# Patient Record
Sex: Male | Born: 1945 | Race: White | Hispanic: No | Marital: Married
Health system: Southern US, Community
[De-identification: ages and names within clinical notes are randomized; demographics above are authoritative.]

---

## 2016-12-08 ENCOUNTER — Inpatient Hospital Stay
Admission: AD | Admit: 2016-12-08 | Discharge: 2017-01-12 | Disposition: A | Discharge status: Skilled Nursing Facility | Payer: Self-pay | Source: Ambulatory Visit | Attending: Internal Medicine | Admitting: Internal Medicine

## 2016-12-08 DIAGNOSIS — Z992 Dependence on renal dialysis: Secondary | ICD-10-CM

## 2016-12-08 DIAGNOSIS — R509 Fever, unspecified: Secondary | ICD-10-CM

## 2016-12-08 DIAGNOSIS — D72829 Elevated white blood cell count, unspecified: Secondary | ICD-10-CM

## 2016-12-08 DIAGNOSIS — J189 Pneumonia, unspecified organism: Secondary | ICD-10-CM

## 2016-12-09 LAB — CBC
HCT: 30.6 % — ABNORMAL LOW (ref 39.0–52.0)
Hemoglobin: 9.5 g/dL — ABNORMAL LOW (ref 13.0–17.0)
MCH: 28.8 pg (ref 26.0–34.0)
MCHC: 31 g/dL (ref 30.0–36.0)
MCV: 92.7 fL (ref 78.0–100.0)
PLATELETS: 167 10*3/uL (ref 150–400)
RBC: 3.3 MIL/uL — ABNORMAL LOW (ref 4.22–5.81)
RDW: 17.1 % — AB (ref 11.5–15.5)
WBC: 11.1 10*3/uL — AB (ref 4.0–10.5)

## 2016-12-11 LAB — URINALYSIS, ROUTINE W REFLEX MICROSCOPIC
Bilirubin Urine: NEGATIVE
Glucose, UA: NEGATIVE mg/dL
KETONES UR: NEGATIVE mg/dL
NITRITE: NEGATIVE
PH: 5 (ref 5.0–8.0)
Protein, ur: 100 mg/dL — AB
Specific Gravity, Urine: 1.024 (ref 1.005–1.030)

## 2016-12-11 LAB — BLOOD GAS, ARTERIAL
ACID-BASE DEFICIT: 4.7 mmol/L — AB (ref 0.0–2.0)
Bicarbonate: 20.2 mmol/L (ref 20.0–28.0)
O2 Content: 2 L/min
O2 Saturation: 97.5 %
PH ART: 7.333 — AB (ref 7.350–7.450)
Patient temperature: 98.6
pCO2 arterial: 39.1 mmHg (ref 32.0–48.0)
pO2, Arterial: 96.3 mmHg (ref 83.0–108.0)

## 2016-12-11 LAB — MAGNESIUM: Magnesium: 2.4 mg/dL (ref 1.7–2.4)

## 2016-12-11 LAB — CBC WITH DIFFERENTIAL/PLATELET
Basophils Absolute: 0 10*3/uL (ref 0.0–0.1)
Basophils Relative: 0 %
Eosinophils Absolute: 0.8 10*3/uL — ABNORMAL HIGH (ref 0.0–0.7)
Eosinophils Relative: 6 %
HEMATOCRIT: 31.5 % — AB (ref 39.0–52.0)
HEMOGLOBIN: 9.6 g/dL — AB (ref 13.0–17.0)
Lymphocytes Relative: 10 %
Lymphs Abs: 1.3 10*3/uL (ref 0.7–4.0)
MCH: 28.2 pg (ref 26.0–34.0)
MCHC: 30.5 g/dL (ref 30.0–36.0)
MCV: 92.6 fL (ref 78.0–100.0)
MONOS PCT: 16 %
Monocytes Absolute: 2 10*3/uL — ABNORMAL HIGH (ref 0.1–1.0)
NEUTROS ABS: 8.8 10*3/uL — AB (ref 1.7–7.7)
NEUTROS PCT: 68 %
Platelets: 242 10*3/uL (ref 150–400)
RBC: 3.4 MIL/uL — ABNORMAL LOW (ref 4.22–5.81)
RDW: 17.2 % — ABNORMAL HIGH (ref 11.5–15.5)
WBC: 12.9 10*3/uL — ABNORMAL HIGH (ref 4.0–10.5)

## 2016-12-11 LAB — BASIC METABOLIC PANEL
Anion gap: 19 — ABNORMAL HIGH (ref 5–15)
BUN: 94 mg/dL — ABNORMAL HIGH (ref 6–20)
CHLORIDE: 91 mmol/L — AB (ref 101–111)
CO2: 18 mmol/L — ABNORMAL LOW (ref 22–32)
CREATININE: 6.05 mg/dL — AB (ref 0.61–1.24)
Calcium: 9.5 mg/dL (ref 8.9–10.3)
GFR calc Af Amer: 10 mL/min — ABNORMAL LOW (ref >60)
GFR calc non Af Amer: 8 mL/min — ABNORMAL LOW (ref >60)
Glucose, Bld: 155 mg/dL — ABNORMAL HIGH (ref 65–99)
Potassium: 4.6 mmol/L (ref 3.5–5.1)
SODIUM: 128 mmol/L — AB (ref 135–145)

## 2016-12-11 LAB — RENAL FUNCTION PANEL
ANION GAP: 17 — AB (ref 5–15)
Albumin: 3.4 g/dL — ABNORMAL LOW (ref 3.5–5.0)
BUN: 95 mg/dL — ABNORMAL HIGH (ref 6–20)
CO2: 20 mmol/L — ABNORMAL LOW (ref 22–32)
Calcium: 9.5 mg/dL (ref 8.9–10.3)
Chloride: 92 mmol/L — ABNORMAL LOW (ref 101–111)
Creatinine, Ser: 6.04 mg/dL — ABNORMAL HIGH (ref 0.61–1.24)
GFR calc Af Amer: 10 mL/min — ABNORMAL LOW (ref >60)
GFR calc non Af Amer: 8 mL/min — ABNORMAL LOW (ref >60)
GLUCOSE: 157 mg/dL — AB (ref 65–99)
PHOSPHORUS: 7.8 mg/dL — AB (ref 2.5–4.6)
POTASSIUM: 4.6 mmol/L (ref 3.5–5.1)
Sodium: 129 mmol/L — ABNORMAL LOW (ref 135–145)

## 2016-12-11 LAB — TSH: TSH: 2.202 u[IU]/mL (ref 0.350–4.500)

## 2016-12-11 NOTE — Progress Notes (Signed)
[facility] CONSULT NOTE    Date: 12/11/2016                  Patient Name:  Jamie Kaiser  MRN: [id number]  DOB: 06/04/1946  Age / Sex: 71 y.o., male         PCP: System, Pcp Not In                 Service Requesting Consult: Hospitalist                 Reason for Consult: Acute renal failure/CKD stage III            History of Present Illness: Patient is a 71 y.o. male with a PMHx of acute on chronic diastolic heart,acute renal failure with underlying chronic kidney disease stage III, recent Escherichia coli bacteremia with urinary tract infection, atrial fibrillation, history of acute pericardial effusion, chronic traumatic encephalopathy, diabetes mellitus type 2, morbid obesity, obstructive sleep apnea, hypertension, debility, chronic venous ulceration, and generalized edema, recent metabolic encephalopathy, who was admitted [facility] for ongoing management.  He was admitted to the outside hospital from 11/15/16 to 12/08/2016.  He had a fall at home prior to admission at the outside hospital. He was found to have cellulitis as well as UTI initially pretty was treated with empiric antibiotics.  His baseline creatinine appears to be 2 per prior documents. In addition he was diuresis. Nephrology consultation was pursued. They escalated his diuretics. He was started on dialysis on June 6 and 18. He appears to have 2 dialysis catheters in place.   Medications: Recent discharge medications: 5 mg daily, calcium acetate 2 tablets by mouth 3 times a day with meals, Colace 100 mg twice a day, Lasix drip, Neurontin 600 mg daily, hydrocodone/acetaminophen 7.5 325 mg by mouth every 6 hours, Lantus insulin 10 units each bedtime, Humalog insulin sliding scale, duo neb 3 mL inhaled every 6 hours, metoprolol 25 mg twice a day, multivitamin 1 tablet daily, nystatin powder applied 2 times a day, omeprazole 20 mg twice a day, MiraLax 17 g daily, Seroquel 25 mg each  bedtime, Zocor 40 mg daily   Allergies: No known drug allergies  Past Medical History: acute on chronic diastolic heart,acute renal failure with underlying chronic kidney disease stage III, recent Escherichia coli bacteremia with urinary tract infection, atrial fibrillation, history of acute pericardial effusion, chronic traumatic encephalopathy, diabetes mellitus type 2, morbid obesity, obstructive sleep apnea, hypertension, debility, chronic venous ulceration, and generalized edema, recent metabolic encephalopathy,  Past Surgical History: dialysis catheter placement x2  Family History: Patient lethargic and unable to provide family history  Social History: Patient lethargic and unable to provide social history  Review of Systems: Patient lethargic and unable to provide review of systems  Vital Signs: Temperature 98.1 pulse 104 respirations 17 blood pressure one or 18/73   Physical Exam: General: NAD, laying in bed  Head: Normocephalic, atraumatic.  Eyes: Anicteric, spontaneous EOMs noted  Nose: Mucous membranes moist, not inflammed, nonerythematous.  Throat: Oropharynx nonerythematous, no exudate appreciated.   Neck: Supple, trachea midline.  Lungs:  Normal respiratory effort. Minimal basilar rales  Heart: S1S2 irregular  Abdomen:  BS normoactive. Soft, Nondistended, non-tender.  No masses or organomegaly.  Extremities: 1+ b/l LE edema  Neurologic: Lethargic but arousable, not following commands consistently  Skin: Retracted skin on bilateral lower extremeties    Lab results: Basic Metabolic Panel:  Recent Labs Lab 12/11/16 0718  NA 128*  129*  K 4.6  4.6  CL 91*  92*  CO2 18*  20*  GLUCOSE 155*  157*  BUN 94*  95*  CREATININE 6.05*  6.04*  CALCIUM 9.5  9.5  MG 2.4  PHOS 7.8*    Liver Function Tests:  Recent Labs Lab 12/11/16 0718  ALBUMIN 3.4*   No results for input(s): LIPASE, AMYLASE in the last 168 hours. No results for input(s): AMMONIA  in the last 168 hours.  CBC:  Recent Labs Lab 12/09/16 0951 12/11/16 0718  WBC 11.1* 12.9*  NEUTROABS  --  8.8*  HGB 9.5* 9.6*  HCT 30.6* 31.5*  MCV 92.7 92.6  PLT 167 242    Cardiac Enzymes: No results for input(s): CKTOTAL, CKMB, CKMBINDEX, TROPONINI in the last 168 hours.  BNP: Invalid input(s): POCBNP  CBG: No results for input(s): GLUCAP in the last 168 hours.  Microbiology: No results found for this or any previous visit.  Coagulation Studies: No results for input(s): LABPROT, INR in the last 72 hours.  Urinalysis: No results for input(s): COLORURINE, LABSPEC, PHURINE, GLUCOSEU, HGBUR, BILIRUBINUR, KETONESUR, PROTEINUR, UROBILINOGEN, NITRITE, LEUKOCYTESUR in the last 72 hours.  Invalid input(s): APPERANCEUR    Imaging:  No results found.   Assessment & Plan: Pt is a 71 y.o. male with a PMHx of acute on chronic diastolic heart,acute renal failure with underlying chronic kidney disease stage III, recent Escherichia coli bacteremia with urinary tract infection, atrial fibrillation, history of acute pericardial effusion, chronic traumatic encephalopathy, diabetes mellitus type 2, morbid obesity, obstructive sleep apnea, hypertension, debility, chronic venous ulceration, and generalized edema, recent metabolic encephalopathy, who was admitted [facility] for ongoing management.   1.  Acute renal failure. 2. Chronic kidney disease stage 3 baseline Cr 2.0. 3. Secondary  Hyperparathyroidism. 4. Anemia of CKD. 5. Hyponatremia. 6. Chronic diastolic heart failure. 7. Lower extremity edema.  Plan:  The patient was found to have acute renal failure with underlying chronic kidney disease stage III at the outside hospital. He was initiated on hemodialysis. Of note he appears to have to dialysis catheters in place. We suspect that the right internal jugular dialysis catheter is functional and they may have In place the left internal jugular [place] catheter  for intravenous access as it has occurred before. We will plan for dialysis today as the patient does appear to be confused and azotemia may be playing a role. We will plan for dialysis on Wednesday as well.Further plan to be determined based upon dialysis response.Thanks for consultation.

## 2016-12-12 LAB — CBC
HCT: 30.3 % — ABNORMAL LOW (ref 39.0–52.0)
Hemoglobin: 9.6 g/dL — ABNORMAL LOW (ref 13.0–17.0)
MCH: 28.8 pg (ref 26.0–34.0)
MCHC: 31.7 g/dL (ref 30.0–36.0)
MCV: 91 fL (ref 78.0–100.0)
PLATELETS: 271 10*3/uL (ref 150–400)
RBC: 3.33 MIL/uL — ABNORMAL LOW (ref 4.22–5.81)
RDW: 17 % — ABNORMAL HIGH (ref 11.5–15.5)
WBC: 14.6 10*3/uL — ABNORMAL HIGH (ref 4.0–10.5)

## 2016-12-12 LAB — RENAL FUNCTION PANEL
Albumin: 3.3 g/dL — ABNORMAL LOW (ref 3.5–5.0)
Anion gap: 15 (ref 5–15)
BUN: 116 mg/dL — ABNORMAL HIGH (ref 6–20)
CHLORIDE: 88 mmol/L — AB (ref 101–111)
CO2: 20 mmol/L — AB (ref 22–32)
CREATININE: 6.92 mg/dL — AB (ref 0.61–1.24)
Calcium: 9.3 mg/dL (ref 8.9–10.3)
GFR calc non Af Amer: 7 mL/min — ABNORMAL LOW (ref >60)
GFR, EST AFRICAN AMERICAN: 8 mL/min — AB (ref >60)
GLUCOSE: 193 mg/dL — AB (ref 65–99)
Phosphorus: 7.1 mg/dL — ABNORMAL HIGH (ref 2.5–4.6)
Potassium: 5.1 mmol/L (ref 3.5–5.1)
SODIUM: 123 mmol/L — AB (ref 135–145)

## 2016-12-12 LAB — BLOOD GAS, ARTERIAL
Acid-base deficit: 5.6 mmol/L — ABNORMAL HIGH (ref 0.0–2.0)
Bicarbonate: 19.1 mmol/L — ABNORMAL LOW (ref 20.0–28.0)
Delivery systems: POSITIVE
Expiratory PAP: 8
FIO2: 28
Inspiratory PAP: 18
O2 Saturation: 98.4 %
Patient temperature: 100.1
pCO2 arterial: 38 mmHg (ref 32.0–48.0)
pH, Arterial: 7.327 — ABNORMAL LOW (ref 7.350–7.450)
pO2, Arterial: 120 mmHg — ABNORMAL HIGH (ref 83.0–108.0)

## 2016-12-12 LAB — HEPATITIS B SURFACE ANTIGEN: Hepatitis B Surface Ag: NEGATIVE

## 2016-12-12 LAB — URINE CULTURE

## 2016-12-12 LAB — HEMOGLOBIN A1C
Hgb A1c MFr Bld: 6.5 % — ABNORMAL HIGH (ref 4.8–5.6)
Mean Plasma Glucose: 140 mg/dL

## 2016-12-13 ENCOUNTER — Other Ambulatory Visit (HOSPITAL_COMMUNITY): Payer: Self-pay

## 2016-12-13 LAB — CBC WITH DIFFERENTIAL/PLATELET
BASOS ABS: 0 10*3/uL (ref 0.0–0.1)
Basophils Relative: 0 %
Eosinophils Absolute: 0.2 10*3/uL (ref 0.0–0.7)
Eosinophils Relative: 1 %
HCT: 32.6 % — ABNORMAL LOW (ref 39.0–52.0)
Hemoglobin: 9.8 g/dL — ABNORMAL LOW (ref 13.0–17.0)
LYMPHS ABS: 1.8 10*3/uL (ref 0.7–4.0)
LYMPHS PCT: 14 %
MCH: 27.9 pg (ref 26.0–34.0)
MCHC: 30.1 g/dL (ref 30.0–36.0)
MCV: 92.9 fL (ref 78.0–100.0)
MONO ABS: 1.4 10*3/uL — AB (ref 0.1–1.0)
MONOS PCT: 11 %
NEUTROS ABS: 9.8 10*3/uL — AB (ref 1.7–7.7)
Neutrophils Relative %: 74 %
Platelets: 267 10*3/uL (ref 150–400)
RBC: 3.51 MIL/uL — ABNORMAL LOW (ref 4.22–5.81)
RDW: 17.4 % — AB (ref 11.5–15.5)
WBC: 13.2 10*3/uL — ABNORMAL HIGH (ref 4.0–10.5)

## 2016-12-13 LAB — BASIC METABOLIC PANEL
ANION GAP: 14 (ref 5–15)
BUN: 74 mg/dL — AB (ref 6–20)
CHLORIDE: 93 mmol/L — AB (ref 101–111)
CO2: 22 mmol/L (ref 22–32)
Calcium: 9.3 mg/dL (ref 8.9–10.3)
Creatinine, Ser: 5.23 mg/dL — ABNORMAL HIGH (ref 0.61–1.24)
GFR calc Af Amer: 12 mL/min — ABNORMAL LOW (ref >60)
GFR calc non Af Amer: 10 mL/min — ABNORMAL LOW (ref >60)
GLUCOSE: 183 mg/dL — AB (ref 65–99)
POTASSIUM: 4.7 mmol/L (ref 3.5–5.1)
Sodium: 129 mmol/L — ABNORMAL LOW (ref 135–145)

## 2016-12-13 LAB — HEPATITIS B SURFACE ANTIBODY, QUANTITATIVE: Hepatitis B-Post: <3.1 m[IU]/mL — ABNORMAL LOW (ref >9.9)

## 2016-12-13 LAB — PARATHYROID HORMONE, INTACT (NO CA): PTH: 159 pg/mL — ABNORMAL HIGH (ref 15–65)

## 2016-12-13 LAB — HEPATITIS B CORE ANTIBODY, IGM: Hep B C IgM: NEGATIVE

## 2016-12-13 NOTE — Progress Notes (Signed)
[facility]  ROUNDING NOTE   Subjective:  Patient a bit more awake and alert today. He did complete hemodialysis yesterday. His azotemia did significantly improve thereafter.    Objective:  Vital signs in last 24 hours:  Temperature 100 pulse 67 respirations 20 blood pressure 127/71  Physical Exam: General: No acute distress  Head: Normocephalic, atraumatic. Moist oral mucosal membranes  Eyes: Anicteric  Neck: Supple, trachea midline  Lungs:  Basilar rales, normal effort  Heart: S1S2 no rubs  Abdomen:  Soft, nontender, bowel sounds present  Extremities: brawny peripheral edema.  Neurologic: Awake, alert, following commands  Skin: Skin retraction  Access: R IJ permcath    Basic Metabolic Panel:  Recent Labs Lab 12/11/16 0718 12/12/16 1400 12/13/16 1113  NA 128*  129* 123* 129*  K 4.6  4.6 5.1 4.7  CL 91*  92* 88* 93*  CO2 18*  20* 20* 22  GLUCOSE 155*  157* 193* 183*  BUN 94*  95* 116* 74*  CREATININE 6.05*  6.04* 6.92* 5.23*  CALCIUM 9.5  9.5 9.3 9.3  MG 2.4  --   --   PHOS 7.8* 7.1*  --     Liver Function Tests:  Recent Labs Lab 12/11/16 0718 12/12/16 1400  ALBUMIN 3.4* 3.3*   No results for input(s): LIPASE, AMYLASE in the last 168 hours. No results for input(s): AMMONIA in the last 168 hours.  CBC:  Recent Labs Lab 12/09/16 0951 12/11/16 0718 12/12/16 1453 12/13/16 1113  WBC 11.1* 12.9* 14.6* 13.2*  NEUTROABS  --  8.8*  --  9.8*  HGB 9.5* 9.6* 9.6* 9.8*  HCT 30.6* 31.5* 30.3* 32.6*  MCV 92.7 92.6 91.0 92.9  PLT 167 242 271 267    Cardiac Enzymes: No results for input(s): CKTOTAL, CKMB, CKMBINDEX, TROPONINI in the last 168 hours.  BNP: Invalid input(s): POCBNP  CBG: No results for input(s): GLUCAP in the last 168 hours.  Microbiology: Results for orders placed or performed during the hospital encounter of 12/08/16  Culture, Urine     Status: Abnormal   Collection Time: 12/11/16  4:07 PM  Result Value Ref  Range Status   Specimen Description URINE, RANDOM  Final   Special Requests NONE  Final   Culture >=100,000 COLONIES/mL YEAST (A)  Final   Report Status 12/12/2016 FINAL  Final    Coagulation Studies: No results for input(s): LABPROT, INR in the last 72 hours.  Urinalysis:  Recent Labs  12/11/16 1607  COLORURINE AMBER*  LABSPEC 1.024  PHURINE 5.0  GLUCOSEU NEGATIVE  HGBUR LARGE*  BILIRUBINUR NEGATIVE  KETONESUR NEGATIVE  PROTEINUR 100*  NITRITE NEGATIVE  LEUKOCYTESUR LARGE*      Imaging: Dg Chest Port 1 View  Result Date: 12/13/2016 CLINICAL DATA:  Fever. EXAM: PORTABLE CHEST 1 VIEW COMPARISON:  No prior . FINDINGS: Dual-lumen right IJ catheter noted with its tip over the mid right atrium. Dual-lumen left IJ catheter noted with its tip over the cavoatrial junction. Cardiomegaly with pulmonary venous congestion. Low lung volumes with basilar atelectasis. Small left pleural effusion cannot be excluded. Right costophrenic angle incompletely imaged. No pneumothorax. IMPRESSION: 1.  Dual-lumen central catheter is as above. 2. Cardiomegaly with pulmonary venous congestion. Small left pleural effusion cannot be excluded. 3. Low lung volumes with basilar atelectasis . Electronically Signed   By: Maisie Fushomas  Register   On: 12/13/2016 11:34     Medications:       Assessment/ Plan:  71 y.o. male with a PMHx of acute  on chronic diastolic heart,acute renal failure with underlying chronic kidney disease stage III, recent Escherichia coli bacteremia with urinary tract infection, atrial fibrillation, history of acute pericardial effusion, chronic traumatic encephalopathy, diabetes mellitus type 2, morbid obesity, obstructive sleep apnea, hypertension, debility, chronic venous ulceration, and generalized edema, recent metabolic encephalopathy, who was admitted [facility] for ongoing management.   1.  Acute renal failure. 2. Chronic kidney disease stage 3 baseline Cr 2.0. 3.  Secondary  Hyperparathyroidism. 4. Anemia of CKD. 5. Hyponatremia. 6. Chronic diastolic heart failure. 7. Lower extremity edema.  Plan:  Azotemia has improved with dialysis. We will plan for dialysis again tomorrow. Unclear how much renal function the patient will regain. The patient's anemia appears to be stable most recent hemoglobin 9.8. We will also continue to monitor bone metabolism parameters. Serum sodium has improved to 129. We will also continue to monitor this with dialysis. Further plan as patient progresses.   LOS: 0 Archie Atilano 6/20/20183:45 PM

## 2016-12-14 LAB — BLOOD CULTURE ID PANEL (REFLEXED)
Acinetobacter baumannii: NOT DETECTED
Candida albicans: NOT DETECTED
Candida glabrata: NOT DETECTED
Candida krusei: NOT DETECTED
Candida parapsilosis: NOT DETECTED
Candida tropicalis: NOT DETECTED
ENTEROBACTER CLOACAE COMPLEX: NOT DETECTED
ENTEROCOCCUS SPECIES: NOT DETECTED
Enterobacteriaceae species: NOT DETECTED
Escherichia coli: NOT DETECTED
Haemophilus influenzae: NOT DETECTED
Klebsiella oxytoca: NOT DETECTED
Klebsiella pneumoniae: NOT DETECTED
LISTERIA MONOCYTOGENES: NOT DETECTED
Methicillin resistance: DETECTED — AB
Neisseria meningitidis: NOT DETECTED
PROTEUS SPECIES: NOT DETECTED
Pseudomonas aeruginosa: NOT DETECTED
SERRATIA MARCESCENS: NOT DETECTED
STAPHYLOCOCCUS AUREUS BCID: DETECTED — AB
STAPHYLOCOCCUS SPECIES: DETECTED — AB
STREPTOCOCCUS AGALACTIAE: NOT DETECTED
STREPTOCOCCUS SPECIES: NOT DETECTED
Streptococcus pneumoniae: NOT DETECTED
Streptococcus pyogenes: NOT DETECTED

## 2016-12-14 LAB — RENAL FUNCTION PANEL
ALBUMIN: 3 g/dL — AB (ref 3.5–5.0)
Anion gap: 15 (ref 5–15)
BUN: 88 mg/dL — AB (ref 6–20)
CO2: 22 mmol/L (ref 22–32)
CREATININE: 5.64 mg/dL — AB (ref 0.61–1.24)
Calcium: 9.2 mg/dL (ref 8.9–10.3)
Chloride: 92 mmol/L — ABNORMAL LOW (ref 101–111)
GFR calc Af Amer: 11 mL/min — ABNORMAL LOW (ref >60)
GFR, EST NON AFRICAN AMERICAN: 9 mL/min — AB (ref >60)
Glucose, Bld: 175 mg/dL — ABNORMAL HIGH (ref 65–99)
PHOSPHORUS: 5.7 mg/dL — AB (ref 2.5–4.6)
POTASSIUM: 4.5 mmol/L (ref 3.5–5.1)
Sodium: 129 mmol/L — ABNORMAL LOW (ref 135–145)

## 2016-12-14 LAB — CBC
HEMATOCRIT: 31.4 % — AB (ref 39.0–52.0)
Hemoglobin: 9.7 g/dL — ABNORMAL LOW (ref 13.0–17.0)
MCH: 28.3 pg (ref 26.0–34.0)
MCHC: 30.9 g/dL (ref 30.0–36.0)
MCV: 91.5 fL (ref 78.0–100.0)
Platelets: 285 10*3/uL (ref 150–400)
RBC: 3.43 MIL/uL — ABNORMAL LOW (ref 4.22–5.81)
RDW: 17.1 % — AB (ref 11.5–15.5)
WBC: 15.8 10*3/uL — ABNORMAL HIGH (ref 4.0–10.5)

## 2016-12-15 NOTE — Progress Notes (Signed)
[facility]  ROUNDING NOTE   Subjective:  Patient will be due for hemodialysis again tomorrow. He is currently awake and alert but remains confused.    Objective:  Vital signs in last 24 hours:  Temperature 97.6 pulse 100 respirations 22 blood pressure 115/71  Physical Exam: General: No acute distress  Head: Normocephalic, atraumatic. Moist oral mucosal membranes  Eyes: Anicteric  Neck: Supple, trachea midline  Lungs:  Basilar rales, normal effort  Heart: S1S2 no rubs  Abdomen:  Soft, nontender, bowel sounds present  Extremities: brawny peripheral edema.  Neurologic: Awake, alert, following commands  Skin: Skin retraction bilateral LE's  Access: R IJ permcath, left sided dialysis catheter in place also (3rd port being used for infusion)    Basic Metabolic Panel:  Recent Labs Lab 12/11/16 0718 12/12/16 1400 12/13/16 1113 12/14/16 0527  NA 128*  129* 123* 129* 129*  K 4.6  4.6 5.1 4.7 4.5  CL 91*  92* 88* 93* 92*  CO2 18*  20* 20* 22 22  GLUCOSE 155*  157* 193* 183* 175*  BUN 94*  95* 116* 74* 88*  CREATININE 6.05*  6.04* 6.92* 5.23* 5.64*  CALCIUM 9.5  9.5 9.3 9.3 9.2  MG 2.4  --   --   --   PHOS 7.8* 7.1*  --  5.7*    Liver Function Tests:  Recent Labs Lab 12/11/16 0718 12/12/16 1400 12/14/16 0527  ALBUMIN 3.4* 3.3* 3.0*   No results for input(s): LIPASE, AMYLASE in the last 168 hours. No results for input(s): AMMONIA in the last 168 hours.  CBC:  Recent Labs Lab 12/09/16 0951 12/11/16 0718 12/12/16 1453 12/13/16 1113 12/14/16 0527  WBC 11.1* 12.9* 14.6* 13.2* 15.8*  NEUTROABS  --  8.8*  --  9.8*  --   HGB 9.5* 9.6* 9.6* 9.8* 9.7*  HCT 30.6* 31.5* 30.3* 32.6* 31.4*  MCV 92.7 92.6 91.0 92.9 91.5  PLT 167 242 271 267 285    Cardiac Enzymes: No results for input(s): CKTOTAL, CKMB, CKMBINDEX, TROPONINI in the last 168 hours.  BNP: Invalid input(s): POCBNP  CBG: No results for input(s): GLUCAP in the last 168  hours.  Microbiology: Results for orders placed or performed during the hospital encounter of 12/08/16  Culture, Urine     Status: Abnormal   Collection Time: 12/11/16  4:07 PM  Result Value Ref Range Status   Specimen Description URINE, RANDOM  Final   Special Requests NONE  Final   Culture >=100,000 COLONIES/mL YEAST (A)  Final   Report Status 12/12/2016 FINAL  Final  Culture, blood (routine x 2)     Status: Abnormal (Preliminary result)   Collection Time: 12/13/16 10:58 AM  Result Value Ref Range Status   Specimen Description BLOOD LEFT ARM  Final   Special Requests IN PEDIATRIC BOTTLE Blood Culture adequate volume  Final   Culture  Setup Time   Final    GRAM POSITIVE COCCI IN CLUSTERS AEROBIC BOTTLE ONLY CRITICAL RESULT CALLED TO, READ BACK BY AND VERIFIED WITH: M WISE 12/14/16 @ 0945 M VESTAL    Culture (A)  Final    STAPHYLOCOCCUS AUREUS SUSCEPTIBILITIES TO FOLLOW    Report Status PENDING  Incomplete  Culture, blood (routine x 2)     Status: None (Preliminary result)   Collection Time: 12/13/16 10:58 AM  Result Value Ref Range Status   Specimen Description BLOOD LEFT HAND  Final   Special Requests IN PEDIATRIC BOTTLE Blood Culture adequate volume  Final  Culture NO GROWTH 1 DAY  Final   Report Status PENDING  Incomplete  Blood Culture ID Panel (Reflexed)     Status: Abnormal   Collection Time: 12/13/16 10:58 AM  Result Value Ref Range Status   Enterococcus species NOT DETECTED NOT DETECTED Final   Listeria monocytogenes NOT DETECTED NOT DETECTED Final   Staphylococcus species DETECTED (A) NOT DETECTED Final    Comment: CRITICAL RESULT CALLED TO, READ BACK BY AND VERIFIED WITH: M WISE 12/14/16 @ 0945 M VESTAL    Staphylococcus aureus DETECTED (A) NOT DETECTED Final    Comment: Methicillin (oxacillin)-resistant Staphylococcus aureus (MRSA). MRSA is predictably resistant to beta-lactam antibiotics (except ceftaroline). Preferred therapy is vancomycin unless clinically  contraindicated. Patient requires contact precautions if  hospitalized. CRITICAL RESULT CALLED TO, READ BACK BY AND VERIFIED WITH: M WISE 12/14/16 @ 0945 M VESTAL    Methicillin resistance DETECTED (A) NOT DETECTED Final    Comment: CRITICAL RESULT CALLED TO, READ BACK BY AND VERIFIED WITH: M WISE 12/14/16 @ 0945 M VESTAL    Streptococcus species NOT DETECTED NOT DETECTED Final   Streptococcus agalactiae NOT DETECTED NOT DETECTED Final   Streptococcus pneumoniae NOT DETECTED NOT DETECTED Final   Streptococcus pyogenes NOT DETECTED NOT DETECTED Final   Acinetobacter baumannii NOT DETECTED NOT DETECTED Final   Enterobacteriaceae species NOT DETECTED NOT DETECTED Final   Enterobacter cloacae complex NOT DETECTED NOT DETECTED Final   Escherichia coli NOT DETECTED NOT DETECTED Final   Klebsiella oxytoca NOT DETECTED NOT DETECTED Final   Klebsiella pneumoniae NOT DETECTED NOT DETECTED Final   Proteus species NOT DETECTED NOT DETECTED Final   Serratia marcescens NOT DETECTED NOT DETECTED Final   Haemophilus influenzae NOT DETECTED NOT DETECTED Final   Neisseria meningitidis NOT DETECTED NOT DETECTED Final   Pseudomonas aeruginosa NOT DETECTED NOT DETECTED Final   Candida albicans NOT DETECTED NOT DETECTED Final   Candida glabrata NOT DETECTED NOT DETECTED Final   Candida krusei NOT DETECTED NOT DETECTED Final   Candida parapsilosis NOT DETECTED NOT DETECTED Final   Candida tropicalis NOT DETECTED NOT DETECTED Final    Coagulation Studies: No results for input(s): LABPROT, INR in the last 72 hours.  Urinalysis: No results for input(s): COLORURINE, LABSPEC, PHURINE, GLUCOSEU, HGBUR, BILIRUBINUR, KETONESUR, PROTEINUR, UROBILINOGEN, NITRITE, LEUKOCYTESUR in the last 72 hours.  Invalid input(s): APPERANCEUR    Imaging: Dg Chest Port 1 View  Result Date: 12/13/2016 CLINICAL DATA:  Fever. EXAM: PORTABLE CHEST 1 VIEW COMPARISON:  No prior . FINDINGS: Dual-lumen right IJ catheter noted with  its tip over the mid right atrium. Dual-lumen left IJ catheter noted with its tip over the cavoatrial junction. Cardiomegaly with pulmonary venous congestion. Low lung volumes with basilar atelectasis. Small left pleural effusion cannot be excluded. Right costophrenic angle incompletely imaged. No pneumothorax. IMPRESSION: 1.  Dual-lumen central catheter is as above. 2. Cardiomegaly with pulmonary venous congestion. Small left pleural effusion cannot be excluded. 3. Low lung volumes with basilar atelectasis . Electronically Signed   By: Maisie Fushomas  Register   On: 12/13/2016 11:34     Medications:       Assessment/ Plan:  71 y.o. male with a PMHx of acute on chronic diastolic heart,acute renal failure with underlying chronic kidney disease stage III, recent Escherichia coli bacteremia with urinary tract infection, atrial fibrillation, history of acute pericardial effusion, chronic traumatic encephalopathy, diabetes mellitus type 2, morbid obesity, obstructive sleep apnea, hypertension, debility, chronic venous ulceration, and generalized edema, recent metabolic encephalopathy, who was admitted  [facility].   1.  Acute renal failure. 2. Chronic kidney disease stage 3 baseline Cr 2.0. 3. Secondary  Hyperparathyroidism. 4. Anemia of CKD. 5. Hyponatremia. 6. Chronic diastolic heart failure. 7. Lower extremity edema.  Plan:  Patient will be due again for hemodialysis tomorrow. Continues to have a high BUN and creatinine.  Hemoglobin currently acceptable at 9.7. Serum phosphorus was down to 5.7 as of yesterday. Continue to monitor serum electrolytes, CBC, and bone marrow metabolism parameters. We will also continue to follow serum sodium as this has increased over the week as well. Further plan as patient progresses.   LOS: 0 Prisila Dlouhy 6/22/20188:27 AM

## 2016-12-16 LAB — BASIC METABOLIC PANEL
Anion gap: 15 (ref 5–15)
BUN: 70 mg/dL — AB (ref 6–20)
CALCIUM: 9.8 mg/dL (ref 8.9–10.3)
CO2: 21 mmol/L — ABNORMAL LOW (ref 22–32)
CREATININE: 5.5 mg/dL — AB (ref 0.61–1.24)
Chloride: 92 mmol/L — ABNORMAL LOW (ref 101–111)
GFR calc Af Amer: 11 mL/min — ABNORMAL LOW (ref >60)
GFR, EST NON AFRICAN AMERICAN: 10 mL/min — AB (ref >60)
GLUCOSE: 139 mg/dL — AB (ref 65–99)
Potassium: 4.9 mmol/L (ref 3.5–5.1)
SODIUM: 128 mmol/L — AB (ref 135–145)

## 2016-12-16 LAB — RENAL FUNCTION PANEL
ALBUMIN: 2.8 g/dL — AB (ref 3.5–5.0)
Anion gap: 16 — ABNORMAL HIGH (ref 5–15)
BUN: 68 mg/dL — ABNORMAL HIGH (ref 6–20)
CALCIUM: 9.8 mg/dL (ref 8.9–10.3)
CO2: 21 mmol/L — ABNORMAL LOW (ref 22–32)
Chloride: 93 mmol/L — ABNORMAL LOW (ref 101–111)
Creatinine, Ser: 5.51 mg/dL — ABNORMAL HIGH (ref 0.61–1.24)
GFR calc Af Amer: 11 mL/min — ABNORMAL LOW (ref >60)
GFR, EST NON AFRICAN AMERICAN: 9 mL/min — AB (ref >60)
GLUCOSE: 136 mg/dL — AB (ref 65–99)
PHOSPHORUS: 4.9 mg/dL — AB (ref 2.5–4.6)
Potassium: 4.5 mmol/L (ref 3.5–5.1)
SODIUM: 130 mmol/L — AB (ref 135–145)

## 2016-12-16 LAB — CBC
HCT: 32.1 % — ABNORMAL LOW (ref 39.0–52.0)
HEMOGLOBIN: 9.8 g/dL — AB (ref 13.0–17.0)
MCH: 28.2 pg (ref 26.0–34.0)
MCHC: 30.5 g/dL (ref 30.0–36.0)
MCV: 92.5 fL (ref 78.0–100.0)
Platelets: 348 10*3/uL (ref 150–400)
RBC: 3.47 MIL/uL — ABNORMAL LOW (ref 4.22–5.81)
RDW: 17.3 % — AB (ref 11.5–15.5)
WBC: 15.8 10*3/uL — ABNORMAL HIGH (ref 4.0–10.5)

## 2016-12-16 LAB — CULTURE, BLOOD (ROUTINE X 2): SPECIAL REQUESTS: ADEQUATE

## 2016-12-16 LAB — VANCOMYCIN, RANDOM: VANCOMYCIN RM: 9

## 2016-12-18 LAB — CULTURE, BLOOD (ROUTINE X 2)
Culture: NO GROWTH
Special Requests: ADEQUATE

## 2016-12-18 NOTE — Progress Notes (Signed)
[facility]  ROUNDING NOTE   Subjective:  Patient will be due for hemodialysis again tomorrow. He is currently awake  Able to follow commands Able to eat. No nausea or vomiting reported    Objective:  Vital signs in last 24 hours:  Temperature 98.3 pulse 110 respirations 14 blood pressure 123/64  Physical Exam: General: No acute distress  Head: Normocephalic, atraumatic. Moist oral mucosal membranes  Eyes: Anicteric  Neck: Supple, trachea midline  Lungs:   normal effort, decreased breath sounds  Heart: S1S2 no rubs  Abdomen:  Soft, nontender, bowel sounds present  Extremities: brawny peripheral edema.  Neurologic: Awake, alert, following commands      Access:  rt IJ PC    Basic Metabolic Panel:  Recent Labs Lab 12/12/16 1400 12/13/16 1113 12/14/16 0527 12/16/16 0323  NA 123* 129* 129* 130*  128*  K 5.1 4.7 4.5 4.5  4.9  CL 88* 93* 92* 93*  92*  CO2 20* 22 22 21*  21*  GLUCOSE 193* 183* 175* 136*  139*  BUN 116* 74* 88* 68*  70*  CREATININE 6.92* 5.23* 5.64* 5.51*  5.50*  CALCIUM 9.3 9.3 9.2 9.8  9.8  PHOS 7.1*  --  5.7* 4.9*    Liver Function Tests:  Recent Labs Lab 12/12/16 1400 12/14/16 0527 12/16/16 0323  ALBUMIN 3.3* 3.0* 2.8*   No results for input(s): LIPASE, AMYLASE in the last 168 hours. No results for input(s): AMMONIA in the last 168 hours.  CBC:  Recent Labs Lab 12/12/16 1453 12/13/16 1113 12/14/16 0527 12/16/16 0323  WBC 14.6* 13.2* 15.8* 15.8*  NEUTROABS  --  9.8*  --   --   HGB 9.6* 9.8* 9.7* 9.8*  HCT 30.3* 32.6* 31.4* 32.1*  MCV 91.0 92.9 91.5 92.5  PLT 271 267 285 348    Cardiac Enzymes: No results for input(s): CKTOTAL, CKMB, CKMBINDEX, TROPONINI in the last 168 hours.  BNP: Invalid input(s): POCBNP  CBG: No results for input(s): GLUCAP in the last 168 hours.  Microbiology: Results for orders placed or performed during the hospital encounter of 12/08/16  Culture, Urine     Status: Abnormal    Collection Time: 12/11/16  4:07 PM  Result Value Ref Range Status   Specimen Description URINE, RANDOM  Final   Special Requests NONE  Final   Culture >=100,000 COLONIES/mL YEAST (A)  Final   Report Status 12/12/2016 FINAL  Final  Culture, blood (routine x 2)     Status: Abnormal   Collection Time: 12/13/16 10:58 AM  Result Value Ref Range Status   Specimen Description BLOOD LEFT ARM  Final   Special Requests IN PEDIATRIC BOTTLE Blood Culture adequate volume  Final   Culture  Setup Time   Final    GRAM POSITIVE COCCI IN CLUSTERS AEROBIC BOTTLE ONLY CRITICAL RESULT CALLED TO, READ BACK BY AND VERIFIED WITH: M WISE 12/14/16 @ 0945 M VESTAL    Culture (A)  Final    STAPHYLOCOCCUS AUREUS STAPHYLOCOCCUS SPECIES (COAGULASE NEGATIVE) THE SIGNIFICANCE OF ISOLATING THIS ORGANISM FROM A SINGLE SET OF BLOOD CULTURES WHEN MULTIPLE SETS ARE DRAWN IS UNCERTAIN. PLEASE NOTIFY THE MICROBIOLOGY DEPARTMENT WITHIN ONE WEEK IF SPECIATION AND SENSITIVITIES ARE REQUIRED.    Report Status 12/16/2016 FINAL  Final   Organism ID, Bacteria STAPHYLOCOCCUS AUREUS  Final      Susceptibility   Staphylococcus aureus - MIC*    CIPROFLOXACIN <=0.5 SENSITIVE Sensitive     ERYTHROMYCIN <=0.25 SENSITIVE Sensitive  GENTAMICIN <=0.5 SENSITIVE Sensitive     OXACILLIN 0.5 SENSITIVE Sensitive     TETRACYCLINE <=1 SENSITIVE Sensitive     VANCOMYCIN <=0.5 SENSITIVE Sensitive     TRIMETH/SULFA <=10 SENSITIVE Sensitive     CLINDAMYCIN <=0.25 SENSITIVE Sensitive     RIFAMPIN <=0.5 SENSITIVE Sensitive     Inducible Clindamycin NEGATIVE Sensitive     * STAPHYLOCOCCUS AUREUS  Culture, blood (routine x 2)     Status: None   Collection Time: 12/13/16 10:58 AM  Result Value Ref Range Status   Specimen Description BLOOD LEFT HAND  Final   Special Requests IN PEDIATRIC BOTTLE Blood Culture adequate volume  Final   Culture NO GROWTH 5 DAYS  Final   Report Status 12/18/2016 FINAL  Final  Blood Culture ID Panel (Reflexed)      Status: Abnormal   Collection Time: 12/13/16 10:58 AM  Result Value Ref Range Status   Enterococcus species NOT DETECTED NOT DETECTED Final   Listeria monocytogenes NOT DETECTED NOT DETECTED Final   Staphylococcus species DETECTED (A) NOT DETECTED Final    Comment: CRITICAL RESULT CALLED TO, READ BACK BY AND VERIFIED WITH: M WISE 12/14/16 @ 0945 M VESTAL    Staphylococcus aureus DETECTED (A) NOT DETECTED Final    Comment: Methicillin (oxacillin)-resistant Staphylococcus aureus (MRSA). MRSA is predictably resistant to beta-lactam antibiotics (except ceftaroline). Preferred therapy is vancomycin unless clinically contraindicated. Patient requires contact precautions if  hospitalized. CRITICAL RESULT CALLED TO, READ BACK BY AND VERIFIED WITH: M WISE 12/14/16 @ 0945 M VESTAL    Methicillin resistance DETECTED (A) NOT DETECTED Final    Comment: CRITICAL RESULT CALLED TO, READ BACK BY AND VERIFIED WITH: M WISE 12/14/16 @ 0945 M VESTAL    Streptococcus species NOT DETECTED NOT DETECTED Final   Streptococcus agalactiae NOT DETECTED NOT DETECTED Final   Streptococcus pneumoniae NOT DETECTED NOT DETECTED Final   Streptococcus pyogenes NOT DETECTED NOT DETECTED Final   Acinetobacter baumannii NOT DETECTED NOT DETECTED Final   Enterobacteriaceae species NOT DETECTED NOT DETECTED Final   Enterobacter cloacae complex NOT DETECTED NOT DETECTED Final   Escherichia coli NOT DETECTED NOT DETECTED Final   Klebsiella oxytoca NOT DETECTED NOT DETECTED Final   Klebsiella pneumoniae NOT DETECTED NOT DETECTED Final   Proteus species NOT DETECTED NOT DETECTED Final   Serratia marcescens NOT DETECTED NOT DETECTED Final   Haemophilus influenzae NOT DETECTED NOT DETECTED Final   Neisseria meningitidis NOT DETECTED NOT DETECTED Final   Pseudomonas aeruginosa NOT DETECTED NOT DETECTED Final   Candida albicans NOT DETECTED NOT DETECTED Final   Candida glabrata NOT DETECTED NOT DETECTED Final   Candida krusei NOT  DETECTED NOT DETECTED Final   Candida parapsilosis NOT DETECTED NOT DETECTED Final   Candida tropicalis NOT DETECTED NOT DETECTED Final    Coagulation Studies: No results for input(s): LABPROT, INR in the last 72 hours.  Urinalysis: No results for input(s): COLORURINE, LABSPEC, PHURINE, GLUCOSEU, HGBUR, BILIRUBINUR, KETONESUR, PROTEINUR, UROBILINOGEN, NITRITE, LEUKOCYTESUR in the last 72 hours.  Invalid input(s): APPERANCEUR    Imaging: No results found.   Medications:       Assessment/ Plan:  71 y.o. male with a PMHx of acute on chronic diastolic heart,acute renal failure with underlying chronic kidney disease stage III, recent Escherichia coli bacteremia with urinary tract infection, atrial fibrillation, history of acute pericardial effusion, chronic traumatic encephalopathy, diabetes mellitus type 2, morbid obesity, obstructive sleep apnea, hypertension, debility, chronic venous ulceration, and generalized edema, recent metabolic encephalopathy, who was  admitted [facility] for ongoing management.   1. Acute renal failure. 2. Chronic kidney disease stage 3 baseline Cr 2.0. 3. Secondary  Hyperparathyroidism. 4. Anemia of CKD. 5. Hyponatremia. 6. Chronic diastolic heart failure. 7. Lower extremity edema.  Plan:  Patient will be due again for hemodialysis tomorrow. Continues to have a high BUN and creatinine.  UOP remains poor. 150 cc last 24 hrs  Hemoglobin currently acceptable at 9.8.  Serum phosphorus was down to 4.9 as of yesterday.  Continue to monitor serum electrolytes, CBC, and bone marrow metabolism parameters. We will also continue to follow serum sodium as this has increased over the week as well. Further plan as patient progresses. D/c fluid restriction   LOS: 0 Jamie Kaiser 6/25/20182:57 PM

## 2016-12-19 LAB — CBC
HCT: 30.9 % — ABNORMAL LOW (ref 39.0–52.0)
Hemoglobin: 9.4 g/dL — ABNORMAL LOW (ref 13.0–17.0)
MCH: 28.3 pg (ref 26.0–34.0)
MCHC: 30.4 g/dL (ref 30.0–36.0)
MCV: 93.1 fL (ref 78.0–100.0)
PLATELETS: 350 10*3/uL (ref 150–400)
RBC: 3.32 MIL/uL — ABNORMAL LOW (ref 4.22–5.81)
RDW: 17 % — ABNORMAL HIGH (ref 11.5–15.5)
WBC: 15.4 10*3/uL — ABNORMAL HIGH (ref 4.0–10.5)

## 2016-12-19 LAB — VANCOMYCIN, TROUGH: Vancomycin Tr: 18 ug/mL (ref 15–20)

## 2016-12-19 LAB — RENAL FUNCTION PANEL
ANION GAP: 12 (ref 5–15)
Albumin: 2.5 g/dL — ABNORMAL LOW (ref 3.5–5.0)
BUN: 62 mg/dL — ABNORMAL HIGH (ref 6–20)
CALCIUM: 10.4 mg/dL — AB (ref 8.9–10.3)
CHLORIDE: 92 mmol/L — AB (ref 101–111)
CO2: 26 mmol/L (ref 22–32)
Creatinine, Ser: 5.07 mg/dL — ABNORMAL HIGH (ref 0.61–1.24)
GFR calc Af Amer: 12 mL/min — ABNORMAL LOW (ref >60)
GFR calc non Af Amer: 10 mL/min — ABNORMAL LOW (ref >60)
GLUCOSE: 212 mg/dL — AB (ref 65–99)
Phosphorus: 4 mg/dL (ref 2.5–4.6)
Potassium: 3.8 mmol/L (ref 3.5–5.1)
SODIUM: 130 mmol/L — AB (ref 135–145)

## 2016-12-19 MED ORDER — ALBUMIN HUMAN 5 % IV SOLN: 12.5000 g | Freq: Once | INTRAVENOUS | Status: DC

## 2016-12-20 NOTE — Progress Notes (Signed)
[facility]  ROUNDING NOTE   Subjective:  Patient will be due for hemodialysis again tomorrow. He is currently resting quietly. desats < 86 % noted with snoring while asleep Able to follow commands uop 375 cc Foley collection  Objective:  Vital signs in last 24 hours:  Temperature 97.8 pulse 87 respirations 17 blood pressure 117/55  Physical Exam: General: No acute distress, obese  Head: Normocephalic, atraumatic. Moist oral mucosal membranes  Eyes: Anicteric  Neck: Supple, trachea midline  Lungs:   normal effort, decreased breath sounds  Heart: S1S2 no rubs  Abdomen:  Soft, nontender, bowel sounds present  Extremities: brawny peripheral edema. Chronic stasis dermatitis  Neurologic: Awake, alert, following commands      Access:  rt IJ PC    Basic Metabolic Panel:  Recent Labs Lab 12/14/16 0527 12/16/16 0323 12/19/16 1207  NA 129* 130*  128* 130*  K 4.5 4.5  4.9 3.8  CL 92* 93*  92* 92*  CO2 22 21*  21* 26  GLUCOSE 175* 136*  139* 212*  BUN 88* 68*  70* 62*  CREATININE 5.64* 5.51*  5.50* 5.07*  CALCIUM 9.2 9.8  9.8 10.4*  PHOS 5.7* 4.9* 4.0    Liver Function Tests:  Recent Labs Lab 12/14/16 0527 12/16/16 0323 12/19/16 1207  ALBUMIN 3.0* 2.8* 2.5*   No results for input(s): LIPASE, AMYLASE in the last 168 hours. No results for input(s): AMMONIA in the last 168 hours.  CBC:  Recent Labs Lab 12/14/16 0527 12/16/16 0323 12/19/16 1205  WBC 15.8* 15.8* 15.4*  HGB 9.7* 9.8* 9.4*  HCT 31.4* 32.1* 30.9*  MCV 91.5 92.5 93.1  PLT 285 348 350    Cardiac Enzymes: No results for input(s): CKTOTAL, CKMB, CKMBINDEX, TROPONINI in the last 168 hours.  BNP: Invalid input(s): POCBNP  CBG: No results for input(s): GLUCAP in the last 168 hours.  Microbiology: Results for orders placed or performed during the hospital encounter of 12/08/16  Culture, Urine     Status: Abnormal   Collection Time: 12/11/16  4:07 PM  Result Value Ref Range  Status   Specimen Description URINE, RANDOM  Final   Special Requests NONE  Final   Culture >=100,000 COLONIES/mL YEAST (A)  Final   Report Status 12/12/2016 FINAL  Final  Culture, blood (routine x 2)     Status: Abnormal   Collection Time: 12/13/16 10:58 AM  Result Value Ref Range Status   Specimen Description BLOOD LEFT ARM  Final   Special Requests IN PEDIATRIC BOTTLE Blood Culture adequate volume  Final   Culture  Setup Time   Final    GRAM POSITIVE COCCI IN CLUSTERS AEROBIC BOTTLE ONLY CRITICAL RESULT CALLED TO, READ BACK BY AND VERIFIED WITH: M WISE 12/14/16 @ 0945 M VESTAL    Culture (A)  Final    STAPHYLOCOCCUS AUREUS STAPHYLOCOCCUS SPECIES (COAGULASE NEGATIVE) THE SIGNIFICANCE OF ISOLATING THIS ORGANISM FROM A SINGLE SET OF BLOOD CULTURES WHEN MULTIPLE SETS ARE DRAWN IS UNCERTAIN. PLEASE NOTIFY THE MICROBIOLOGY DEPARTMENT WITHIN ONE WEEK IF SPECIATION AND SENSITIVITIES ARE REQUIRED.    Report Status 12/16/2016 FINAL  Final   Organism ID, Bacteria STAPHYLOCOCCUS AUREUS  Final      Susceptibility   Staphylococcus aureus - MIC*    CIPROFLOXACIN <=0.5 SENSITIVE Sensitive     ERYTHROMYCIN <=0.25 SENSITIVE Sensitive     GENTAMICIN <=0.5 SENSITIVE Sensitive     OXACILLIN 0.5 SENSITIVE Sensitive     TETRACYCLINE <=1 SENSITIVE Sensitive  VANCOMYCIN <=0.5 SENSITIVE Sensitive     TRIMETH/SULFA <=10 SENSITIVE Sensitive     CLINDAMYCIN <=0.25 SENSITIVE Sensitive     RIFAMPIN <=0.5 SENSITIVE Sensitive     Inducible Clindamycin NEGATIVE Sensitive     * STAPHYLOCOCCUS AUREUS  Culture, blood (routine x 2)     Status: None   Collection Time: 12/13/16 10:58 AM  Result Value Ref Range Status   Specimen Description BLOOD LEFT HAND  Final   Special Requests IN PEDIATRIC BOTTLE Blood Culture adequate volume  Final   Culture NO GROWTH 5 DAYS  Final   Report Status 12/18/2016 FINAL  Final  Blood Culture ID Panel (Reflexed)     Status: Abnormal   Collection Time: 12/13/16 10:58 AM   Result Value Ref Range Status   Enterococcus species NOT DETECTED NOT DETECTED Final   Listeria monocytogenes NOT DETECTED NOT DETECTED Final   Staphylococcus species DETECTED (A) NOT DETECTED Final    Comment: CRITICAL RESULT CALLED TO, READ BACK BY AND VERIFIED WITH: M WISE 12/14/16 @ 0945 M VESTAL    Staphylococcus aureus DETECTED (A) NOT DETECTED Final    Comment: Methicillin (oxacillin)-resistant Staphylococcus aureus (MRSA). MRSA is predictably resistant to beta-lactam antibiotics (except ceftaroline). Preferred therapy is vancomycin unless clinically contraindicated. Patient requires contact precautions if  hospitalized. CRITICAL RESULT CALLED TO, READ BACK BY AND VERIFIED WITH: M WISE 12/14/16 @ 0945 M VESTAL    Methicillin resistance DETECTED (A) NOT DETECTED Final    Comment: CRITICAL RESULT CALLED TO, READ BACK BY AND VERIFIED WITH: M WISE 12/14/16 @ 0945 M VESTAL    Streptococcus species NOT DETECTED NOT DETECTED Final   Streptococcus agalactiae NOT DETECTED NOT DETECTED Final   Streptococcus pneumoniae NOT DETECTED NOT DETECTED Final   Streptococcus pyogenes NOT DETECTED NOT DETECTED Final   Acinetobacter baumannii NOT DETECTED NOT DETECTED Final   Enterobacteriaceae species NOT DETECTED NOT DETECTED Final   Enterobacter cloacae complex NOT DETECTED NOT DETECTED Final   Escherichia coli NOT DETECTED NOT DETECTED Final   Klebsiella oxytoca NOT DETECTED NOT DETECTED Final   Klebsiella pneumoniae NOT DETECTED NOT DETECTED Final   Proteus species NOT DETECTED NOT DETECTED Final   Serratia marcescens NOT DETECTED NOT DETECTED Final   Haemophilus influenzae NOT DETECTED NOT DETECTED Final   Neisseria meningitidis NOT DETECTED NOT DETECTED Final   Pseudomonas aeruginosa NOT DETECTED NOT DETECTED Final   Candida albicans NOT DETECTED NOT DETECTED Final   Candida glabrata NOT DETECTED NOT DETECTED Final   Candida krusei NOT DETECTED NOT DETECTED Final   Candida parapsilosis NOT  DETECTED NOT DETECTED Final   Candida tropicalis NOT DETECTED NOT DETECTED Final  Culture, blood (routine x 2)     Status: None (Preliminary result)   Collection Time: 12/18/16  4:43 PM  Result Value Ref Range Status   Specimen Description BLOOD LEFT ANTECUBITAL  Final   Special Requests   Final    BOTTLES DRAWN AEROBIC ONLY Blood Culture results may not be optimal due to an inadequate volume of blood received in culture bottles   Culture NO GROWTH 2 DAYS  Final   Report Status PENDING  Incomplete  Culture, blood (routine x 2)     Status: None (Preliminary result)   Collection Time: 12/18/16  4:43 PM  Result Value Ref Range Status   Specimen Description BLOOD LEFT ANTECUBITAL  Final   Special Requests   Final    BOTTLES DRAWN AEROBIC ONLY Blood Culture results may not be optimal due to an  inadequate volume of blood received in culture bottles   Culture NO GROWTH 2 DAYS  Final   Report Status PENDING  Incomplete    Coagulation Studies: No results for input(s): LABPROT, INR in the last 72 hours.  Urinalysis: No results for input(s): COLORURINE, LABSPEC, PHURINE, GLUCOSEU, HGBUR, BILIRUBINUR, KETONESUR, PROTEINUR, UROBILINOGEN, NITRITE, LEUKOCYTESUR in the last 72 hours.  Invalid input(s): APPERANCEUR    Imaging: No results found.   Medications:       Assessment/ Plan:  71 y.o. male with a PMHx of acute on chronic diastolic heart,acute renal failure with underlying chronic kidney disease stage III, recent Escherichia coli bacteremia with urinary tract infection, atrial fibrillation, history of acute pericardial effusion, chronic traumatic encephalopathy, diabetes mellitus type 2, morbid obesity, obstructive sleep apnea, hypertension, debility, chronic venous ulceration, and generalized edema, recent metabolic encephalopathy, h/o Chronic traumatic encephalopathy (CTE) foot ball injury; who was admitted [facility] for ongoing management.   1. Acute renal  failure. 2. Chronic kidney disease stage 3 baseline Cr 2.0. Likely diabetic nephropathy 3. Secondary  Hyperparathyroidism. 4. Anemia of CKD. 5. Hyponatremia. 6. Chronic diastolic heart failure. 7. Lower extremity edema. 8. Hyperkalemia  Plan:  Patient has required dialysis since June 6. UOP remains poor with high BUN and creatinine. Seems like this patient will be dialysis dependent. He is approaching ESRD Agree with vein mapping and eventually vascular consult to see if he will be a candidate for access placement Next HDtomorrow   Hemoglobin currently acceptable at 9.4.  Serum phosphorus was down to 4    Continue to monitor serum electrolytes, CBC, and bone marrow metabolism parameters.  Further plan as patient progresses. D/c fluid restriction   LOS: 0 Jamie Kaiser 6/27/20185:15 PM

## 2016-12-21 LAB — RENAL FUNCTION PANEL
ANION GAP: 13 (ref 5–15)
Albumin: 2.5 g/dL — ABNORMAL LOW (ref 3.5–5.0)
BUN: 45 mg/dL — ABNORMAL HIGH (ref 6–20)
CHLORIDE: 95 mmol/L — AB (ref 101–111)
CO2: 23 mmol/L (ref 22–32)
CREATININE: 4.08 mg/dL — AB (ref 0.61–1.24)
Calcium: 10.4 mg/dL — ABNORMAL HIGH (ref 8.9–10.3)
GFR, EST AFRICAN AMERICAN: 16 mL/min — AB (ref >60)
GFR, EST NON AFRICAN AMERICAN: 14 mL/min — AB (ref >60)
Glucose, Bld: 140 mg/dL — ABNORMAL HIGH (ref 65–99)
Phosphorus: 3.6 mg/dL (ref 2.5–4.6)
Potassium: 3.7 mmol/L (ref 3.5–5.1)
Sodium: 131 mmol/L — ABNORMAL LOW (ref 135–145)

## 2016-12-21 LAB — CBC
HEMATOCRIT: 31.9 % — AB (ref 39.0–52.0)
HEMOGLOBIN: 9.6 g/dL — AB (ref 13.0–17.0)
MCH: 28.1 pg (ref 26.0–34.0)
MCHC: 30.1 g/dL (ref 30.0–36.0)
MCV: 93.3 fL (ref 78.0–100.0)
PLATELETS: 315 10*3/uL (ref 150–400)
RBC: 3.42 MIL/uL — AB (ref 4.22–5.81)
RDW: 17.3 % — ABNORMAL HIGH (ref 11.5–15.5)
WBC: 14.5 10*3/uL — AB (ref 4.0–10.5)

## 2016-12-21 LAB — VANCOMYCIN, TROUGH: VANCOMYCIN TR: 15 ug/mL (ref 15–20)

## 2016-12-22 ENCOUNTER — Ambulatory Visit (HOSPITAL_COMMUNITY): Payer: Medicare Other | Attending: Internal Medicine

## 2016-12-22 DIAGNOSIS — Z01818 Encounter for other preprocedural examination: Secondary | ICD-10-CM | POA: Diagnosis present

## 2016-12-22 DIAGNOSIS — Z01810 Encounter for preprocedural cardiovascular examination: Secondary | ICD-10-CM

## 2016-12-22 NOTE — Progress Notes (Signed)
[facility]  ROUNDING NOTE   Subjective:  Patient will be due for hemodialysis again tomorrow. He has low grade fever. Also noted to have chills this morning Foley was removed this morning  nurse reports st 3 decubitus 500 cc removed with HD yesterday  Objective:  Vital signs in last 24 hours:  Temperature 999.4 pulse 120 respirations 14 blood pressure 116/68  Physical Exam: General: No acute distress, obese  Head: Normocephalic, atraumatic. Moist oral mucosal membranes  Eyes: Anicteric  Neck: Supple,    Lungs:   normal effort, decreased breath sounds  Heart: S1S2 no rubs  Abdomen:  Soft, nontender,    Extremities: brawny peripheral edema. Chronic stasis dermatitis  Neurologic: Awake, alert, following commands      Access:  rt IJ PC    Basic Metabolic Panel:  Recent Labs Lab 12/16/16 0323 12/19/16 1207 12/21/16 0520  NA 130*  128* 130* 131*  K 4.5  4.9 3.8 3.7  CL 93*  92* 92* 95*  CO2 21*  21* 26 23  GLUCOSE 136*  139* 212* 140*  BUN 68*  70* 62* 45*  CREATININE 5.51*  5.50* 5.07* 4.08*  CALCIUM 9.8  9.8 10.4* 10.4*  PHOS 4.9* 4.0 3.6    Liver Function Tests:  Recent Labs Lab 12/16/16 0323 12/19/16 1207 12/21/16 0520  ALBUMIN 2.8* 2.5* 2.5*   No results for input(s): LIPASE, AMYLASE in the last 168 hours. No results for input(s): AMMONIA in the last 168 hours.  CBC:  Recent Labs Lab 12/16/16 0323 12/19/16 1205 12/21/16 0520  WBC 15.8* 15.4* 14.5*  HGB 9.8* 9.4* 9.6*  HCT 32.1* 30.9* 31.9*  MCV 92.5 93.1 93.3  PLT 348 350 315    Cardiac Enzymes: No results for input(s): CKTOTAL, CKMB, CKMBINDEX, TROPONINI in the last 168 hours.  BNP: Invalid input(s): POCBNP  CBG: No results for input(s): GLUCAP in the last 168 hours.  Microbiology: Results for orders placed or performed during the hospital encounter of 12/08/16  Culture, Urine     Status: Abnormal   Collection Time: 12/11/16  4:07 PM  Result Value Ref Range  Status   Specimen Description URINE, RANDOM  Final   Special Requests NONE  Final   Culture >=100,000 COLONIES/mL YEAST (A)  Final   Report Status 12/12/2016 FINAL  Final  Culture, blood (routine x 2)     Status: Abnormal   Collection Time: 12/13/16 10:58 AM  Result Value Ref Range Status   Specimen Description BLOOD LEFT ARM  Final   Special Requests IN PEDIATRIC BOTTLE Blood Culture adequate volume  Final   Culture  Setup Time   Final    GRAM POSITIVE COCCI IN CLUSTERS AEROBIC BOTTLE ONLY CRITICAL RESULT CALLED TO, READ BACK BY AND VERIFIED WITH: M WISE 12/14/16 @ 0945 M VESTAL    Culture (A)  Final    STAPHYLOCOCCUS AUREUS STAPHYLOCOCCUS SPECIES (COAGULASE NEGATIVE) THE SIGNIFICANCE OF ISOLATING THIS ORGANISM FROM A SINGLE SET OF BLOOD CULTURES WHEN MULTIPLE SETS ARE DRAWN IS UNCERTAIN. PLEASE NOTIFY THE MICROBIOLOGY DEPARTMENT WITHIN ONE WEEK IF SPECIATION AND SENSITIVITIES ARE REQUIRED.    Report Status 12/16/2016 FINAL  Final   Organism ID, Bacteria STAPHYLOCOCCUS AUREUS  Final      Susceptibility   Staphylococcus aureus - MIC*    CIPROFLOXACIN <=0.5 SENSITIVE Sensitive     ERYTHROMYCIN <=0.25 SENSITIVE Sensitive     GENTAMICIN <=0.5 SENSITIVE Sensitive     OXACILLIN 0.5 SENSITIVE Sensitive     TETRACYCLINE <=1 SENSITIVE  Sensitive     VANCOMYCIN <=0.5 SENSITIVE Sensitive     TRIMETH/SULFA <=10 SENSITIVE Sensitive     CLINDAMYCIN <=0.25 SENSITIVE Sensitive     RIFAMPIN <=0.5 SENSITIVE Sensitive     Inducible Clindamycin NEGATIVE Sensitive     * STAPHYLOCOCCUS AUREUS  Culture, blood (routine x 2)     Status: None   Collection Time: 12/13/16 10:58 AM  Result Value Ref Range Status   Specimen Description BLOOD LEFT HAND  Final   Special Requests IN PEDIATRIC BOTTLE Blood Culture adequate volume  Final   Culture NO GROWTH 5 DAYS  Final   Report Status 12/18/2016 FINAL  Final  Blood Culture ID Panel (Reflexed)     Status: Abnormal   Collection Time: 12/13/16 10:58 AM   Result Value Ref Range Status   Enterococcus species NOT DETECTED NOT DETECTED Final   Listeria monocytogenes NOT DETECTED NOT DETECTED Final   Staphylococcus species DETECTED (A) NOT DETECTED Final    Comment: CRITICAL RESULT CALLED TO, READ BACK BY AND VERIFIED WITH: M WISE 12/14/16 @ 0945 M VESTAL    Staphylococcus aureus DETECTED (A) NOT DETECTED Final    Comment: Methicillin (oxacillin)-resistant Staphylococcus aureus (MRSA). MRSA is predictably resistant to beta-lactam antibiotics (except ceftaroline). Preferred therapy is vancomycin unless clinically contraindicated. Patient requires contact precautions if  hospitalized. CRITICAL RESULT CALLED TO, READ BACK BY AND VERIFIED WITH: M WISE 12/14/16 @ 0945 M VESTAL    Methicillin resistance DETECTED (A) NOT DETECTED Final    Comment: CRITICAL RESULT CALLED TO, READ BACK BY AND VERIFIED WITH: M WISE 12/14/16 @ 0945 M VESTAL    Streptococcus species NOT DETECTED NOT DETECTED Final   Streptococcus agalactiae NOT DETECTED NOT DETECTED Final   Streptococcus pneumoniae NOT DETECTED NOT DETECTED Final   Streptococcus pyogenes NOT DETECTED NOT DETECTED Final   Acinetobacter baumannii NOT DETECTED NOT DETECTED Final   Enterobacteriaceae species NOT DETECTED NOT DETECTED Final   Enterobacter cloacae complex NOT DETECTED NOT DETECTED Final   Escherichia coli NOT DETECTED NOT DETECTED Final   Klebsiella oxytoca NOT DETECTED NOT DETECTED Final   Klebsiella pneumoniae NOT DETECTED NOT DETECTED Final   Proteus species NOT DETECTED NOT DETECTED Final   Serratia marcescens NOT DETECTED NOT DETECTED Final   Haemophilus influenzae NOT DETECTED NOT DETECTED Final   Neisseria meningitidis NOT DETECTED NOT DETECTED Final   Pseudomonas aeruginosa NOT DETECTED NOT DETECTED Final   Candida albicans NOT DETECTED NOT DETECTED Final   Candida glabrata NOT DETECTED NOT DETECTED Final   Candida krusei NOT DETECTED NOT DETECTED Final   Candida parapsilosis NOT  DETECTED NOT DETECTED Final   Candida tropicalis NOT DETECTED NOT DETECTED Final  Culture, blood (routine x 2)     Status: None (Preliminary result)   Collection Time: 12/18/16  4:43 PM  Result Value Ref Range Status   Specimen Description BLOOD LEFT ANTECUBITAL  Final   Special Requests   Final    BOTTLES DRAWN AEROBIC ONLY Blood Culture results may not be optimal due to an inadequate volume of blood received in culture bottles   Culture NO GROWTH 3 DAYS  Final   Report Status PENDING  Incomplete  Culture, blood (routine x 2)     Status: None (Preliminary result)   Collection Time: 12/18/16  4:43 PM  Result Value Ref Range Status   Specimen Description BLOOD LEFT ANTECUBITAL  Final   Special Requests   Final    BOTTLES DRAWN AEROBIC ONLY Blood Culture results may not  be optimal due to an inadequate volume of blood received in culture bottles   Culture NO GROWTH 3 DAYS  Final   Report Status PENDING  Incomplete    Coagulation Studies: No results for input(s): LABPROT, INR in the last 72 hours.  Urinalysis: No results for input(s): COLORURINE, LABSPEC, PHURINE, GLUCOSEU, HGBUR, BILIRUBINUR, KETONESUR, PROTEINUR, UROBILINOGEN, NITRITE, LEUKOCYTESUR in the last 72 hours.  Invalid input(s): APPERANCEUR    Imaging: No results found.   Medications:       Assessment/ Plan:  71 y.o. male with a PMHx of acute on chronic diastolic heart,acute renal failure with underlying chronic kidney disease stage III, recent Escherichia coli bacteremia with urinary tract infection, atrial fibrillation, history of acute pericardial effusion, chronic traumatic encephalopathy, diabetes mellitus type 2, morbid obesity, obstructive sleep apnea, hypertension, debility, chronic venous ulceration, and generalized edema, recent metabolic encephalopathy, h/o Chronic traumatic encephalopathy (CTE) foot ball injury; who was admitted [facility] for ongoing management.   1. Acute renal  failure. 2. Chronic kidney disease stage 3 baseline Cr 2.0. Likely diabetic nephropathy 3. Secondary  Hyperparathyroidism. 4. Anemia of CKD. 5. Hyponatremia. 6. Chronic diastolic heart failure. 7. Lower extremity edema. 8. Hyperkalemia 9. Fever  Plan:  Patient has required dialysis since June 6. UOP remains poor with high BUN and creatinine. Seems like this patient will be dialysis dependent. He is approaching ESRD Agree with vein mapping and eventually vascular consult to see if he will be a candidate for access placement Next HD tomorrow  Obtain blood cultures, U/A and urine culture. May require I/0 cath  Hemoglobin currently acceptable at 9.6  Serum phosphorus 3.6, acceptable Continue to monitor serum electrolytes, CBC, and bone marrow metabolism parameters.  Further plan as patient progresses. D/c fluid restriction   LOS: 0 Zikeria Keough 6/29/20189:06 AM

## 2016-12-22 NOTE — Progress Notes (Signed)
Upper Extremity Vein Map  Right Cephalic  Segment Diameter Depth Comment  1. Axilla 5.1 mm 21 mm   2. Mid upper arm 6.5 mm 6.4 mm   3. Above AC 5.8 mm 9.2 mm branch  4. In AC 7.3 mm 11.2 mm branch  5. Below AC 5.7 mm 14.1 mm branches  6. Mid forearm 4.2 mm 5 mm branch  7. Wrist 2.8 mm 3.3 mm    Right Basilic  Segment Diameter Depth Comment  1. Axilla 5.871mm 24 mm   2. Mid upper arm 4.1 mm 15.5 mm branch  3. Above AC 3.7 mm 9.5 mm   4. In AC 2.9 mm 8.5 m branches  5. Below AC 2.1 mm 8 mm   6. Mid forearm 1.7 mm 6.8 mm branches   Left Cephalic  Segment Diameter Depth Comment  1. Axilla 5.8 mm 15.5 mm   2. Mid upper arm 6.3 mm 7.8 mm branch  3. Above AC 6.4 mm 10.59mm   4. In AC 5.8 mm 9.303mm branch  5. Below AC 4.4 mm 12.8 mm   6. Mid forearm 4.1 mm 6.4 mm branch  7. Wrist 3.6 mm 3.8 mm    Left Basilic  Segment Diameter Depth Comment  1. Origin 5.2 mm 19.3 mm   3. Above AC 4.1 mm 16.8 mm branch  4. In AC 3.9 mm 2.2 mm   5. Below AC 3.9 mm 11.3 mm   6. Mid forearm 4.1 mm 6.8 mm branch  7. Wrist 3.7 mm 3.6 mm    Levin Baconlaire Pearson Reasons- RDMS, RVT 7:00 PM  12/22/2016

## 2016-12-23 LAB — BLOOD CULTURE ID PANEL (REFLEXED)
Acinetobacter baumannii: NOT DETECTED
Candida albicans: NOT DETECTED
Candida glabrata: NOT DETECTED
Candida krusei: NOT DETECTED
Candida parapsilosis: NOT DETECTED
Candida tropicalis: NOT DETECTED
Enterobacter cloacae complex: NOT DETECTED
Enterobacteriaceae species: NOT DETECTED
Enterococcus species: NOT DETECTED
Escherichia coli: NOT DETECTED
HAEMOPHILUS INFLUENZAE: NOT DETECTED
KLEBSIELLA PNEUMONIAE: NOT DETECTED
Klebsiella oxytoca: NOT DETECTED
Listeria monocytogenes: NOT DETECTED
NEISSERIA MENINGITIDIS: NOT DETECTED
PROTEUS SPECIES: NOT DETECTED
Pseudomonas aeruginosa: NOT DETECTED
STAPHYLOCOCCUS SPECIES: NOT DETECTED
STREPTOCOCCUS AGALACTIAE: NOT DETECTED
STREPTOCOCCUS SPECIES: NOT DETECTED
Serratia marcescens: NOT DETECTED
Staphylococcus aureus (BCID): NOT DETECTED
Streptococcus pneumoniae: NOT DETECTED
Streptococcus pyogenes: NOT DETECTED

## 2016-12-23 LAB — CULTURE, BLOOD (ROUTINE X 2)
CULTURE: NO GROWTH
Culture: NO GROWTH

## 2016-12-23 LAB — CBC
HCT: 30.4 % — ABNORMAL LOW (ref 39.0–52.0)
Hemoglobin: 9.2 g/dL — ABNORMAL LOW (ref 13.0–17.0)
MCH: 28.3 pg (ref 26.0–34.0)
MCHC: 30.3 g/dL (ref 30.0–36.0)
MCV: 93.5 fL (ref 78.0–100.0)
PLATELETS: 274 10*3/uL (ref 150–400)
RBC: 3.25 MIL/uL — ABNORMAL LOW (ref 4.22–5.81)
RDW: 17.4 % — ABNORMAL HIGH (ref 11.5–15.5)
WBC: 12.3 10*3/uL — ABNORMAL HIGH (ref 4.0–10.5)

## 2016-12-23 LAB — RENAL FUNCTION PANEL
Albumin: 2.5 g/dL — ABNORMAL LOW (ref 3.5–5.0)
Anion gap: 12 (ref 5–15)
BUN: 48 mg/dL — AB (ref 6–20)
CO2: 23 mmol/L (ref 22–32)
CREATININE: 4.76 mg/dL — AB (ref 0.61–1.24)
Calcium: 9.8 mg/dL (ref 8.9–10.3)
Chloride: 97 mmol/L — ABNORMAL LOW (ref 101–111)
GFR calc Af Amer: 13 mL/min — ABNORMAL LOW (ref >60)
GFR calc non Af Amer: 11 mL/min — ABNORMAL LOW (ref >60)
GLUCOSE: 110 mg/dL — AB (ref 65–99)
Phosphorus: 3.5 mg/dL (ref 2.5–4.6)
Potassium: 3.9 mmol/L (ref 3.5–5.1)
Sodium: 132 mmol/L — ABNORMAL LOW (ref 135–145)

## 2016-12-24 ENCOUNTER — Other Ambulatory Visit (HOSPITAL_COMMUNITY): Payer: Self-pay

## 2016-12-25 ENCOUNTER — Other Ambulatory Visit (HOSPITAL_BASED_OUTPATIENT_CLINIC_OR_DEPARTMENT_OTHER): Payer: Self-pay

## 2016-12-25 ENCOUNTER — Other Ambulatory Visit (HOSPITAL_COMMUNITY): Payer: Self-pay

## 2016-12-25 ENCOUNTER — Encounter (HOSPITAL_COMMUNITY): Payer: Self-pay | Admitting: Interventional Radiology

## 2016-12-25 DIAGNOSIS — I339 Acute and subacute endocarditis, unspecified: Secondary | ICD-10-CM

## 2016-12-25 HISTORY — PX: IR REMOVAL TUN CV CATH W/O FL: IMG2289

## 2016-12-25 LAB — CULTURE, BLOOD (ROUTINE X 2): Special Requests: ADEQUATE

## 2016-12-25 LAB — VANCOMYCIN, RANDOM: VANCOMYCIN RM: 11

## 2016-12-25 MED ORDER — CHLORHEXIDINE GLUCONATE 4 % EX LIQD
CUTANEOUS | Status: AC
  Filled 2016-12-25: qty 15

## 2016-12-25 NOTE — Progress Notes (Signed)
Patient is currently not in the room - gone for procedure Will see on Wednesday

## 2016-12-25 NOTE — Progress Notes (Signed)
  Echocardiogram 2D Echocardiogram has been performed.  Janalyn HarderWest, Nettie Cromwell R 12/25/2016, 11:14 AM

## 2016-12-25 NOTE — Progress Notes (Signed)
[facility]  ROUNDING NOTE   Subjective:  Rt IJ PC removed Peripheral blood cultures are positive Blood culture panel detected staph spp and aureus Denies any SOB  Objective:  Vital signs in last 24 hours:  Temperature 98.5 pulse 110 respirations 11 blood pressure 124/75  Physical Exam: General: No acute distress, obese  Head: Normocephalic, atraumatic. Moist oral mucosal membranes  Eyes: Anicteric  Neck: Supple,    Lungs:   normal effort, decreased breath sounds  Heart: S1S2 no rubs  Abdomen:  Soft, nontender,    Extremities: brawny peripheral edema. Chronic stasis dermatitis  Neurologic: Awake, alert, following commands      Access:      Basic Metabolic Panel:  Recent Labs Lab 12/19/16 1207 12/21/16 0520 12/23/16 0510  NA 130* 131* 132*  K 3.8 3.7 3.9  CL 92* 95* 97*  CO2 26 23 23   GLUCOSE 212* 140* 110*  BUN 62* 45* 48*  CREATININE 5.07* 4.08* 4.76*  CALCIUM 10.4* 10.4* 9.8  PHOS 4.0 3.6 3.5    Liver Function Tests:  Recent Labs Lab 12/19/16 1207 12/21/16 0520 12/23/16 0510  ALBUMIN 2.5* 2.5* 2.5*   No results for input(s): LIPASE, AMYLASE in the last 168 hours. No results for input(s): AMMONIA in the last 168 hours.  CBC:  Recent Labs Lab 12/19/16 1205 12/21/16 0520 12/23/16 0510  WBC 15.4* 14.5* 12.3*  HGB 9.4* 9.6* 9.2*  HCT 30.9* 31.9* 30.4*  MCV 93.1 93.3 93.5  PLT 350 315 274    Cardiac Enzymes: No results for input(s): CKTOTAL, CKMB, CKMBINDEX, TROPONINI in the last 168 hours.  BNP: Invalid input(s): POCBNP  CBG: No results for input(s): GLUCAP in the last 168 hours.  Microbiology: Results for orders placed or performed during the hospital encounter of 12/08/16  Culture, Urine     Status: Abnormal   Collection Time: 12/11/16  4:07 PM  Result Value Ref Range Status   Specimen Description URINE, RANDOM  Final   Special Requests NONE  Final   Culture >=100,000 COLONIES/mL YEAST (A)  Final   Report Status  12/12/2016 FINAL  Final  Culture, blood (routine x 2)     Status: Abnormal   Collection Time: 12/13/16 10:58 AM  Result Value Ref Range Status   Specimen Description BLOOD LEFT ARM  Final   Special Requests IN PEDIATRIC BOTTLE Blood Culture adequate volume  Final   Culture  Setup Time   Final    GRAM POSITIVE COCCI IN CLUSTERS AEROBIC BOTTLE ONLY CRITICAL RESULT CALLED TO, READ BACK BY AND VERIFIED WITH: M WISE 12/14/16 @ 0945 M VESTAL    Culture (A)  Final    STAPHYLOCOCCUS AUREUS STAPHYLOCOCCUS SPECIES (COAGULASE NEGATIVE) THE SIGNIFICANCE OF ISOLATING THIS ORGANISM FROM A SINGLE SET OF BLOOD CULTURES WHEN MULTIPLE SETS ARE DRAWN IS UNCERTAIN. PLEASE NOTIFY THE MICROBIOLOGY DEPARTMENT WITHIN ONE WEEK IF SPECIATION AND SENSITIVITIES ARE REQUIRED.    Report Status 12/16/2016 FINAL  Final   Organism ID, Bacteria STAPHYLOCOCCUS AUREUS  Final      Susceptibility   Staphylococcus aureus - MIC*    CIPROFLOXACIN <=0.5 SENSITIVE Sensitive     ERYTHROMYCIN <=0.25 SENSITIVE Sensitive     GENTAMICIN <=0.5 SENSITIVE Sensitive     OXACILLIN 0.5 SENSITIVE Sensitive     TETRACYCLINE <=1 SENSITIVE Sensitive     VANCOMYCIN <=0.5 SENSITIVE Sensitive     TRIMETH/SULFA <=10 SENSITIVE Sensitive     CLINDAMYCIN <=0.25 SENSITIVE Sensitive     RIFAMPIN <=0.5 SENSITIVE Sensitive  Inducible Clindamycin NEGATIVE Sensitive     * STAPHYLOCOCCUS AUREUS  Culture, blood (routine x 2)     Status: None   Collection Time: 12/13/16 10:58 AM  Result Value Ref Range Status   Specimen Description BLOOD LEFT HAND  Final   Special Requests IN PEDIATRIC BOTTLE Blood Culture adequate volume  Final   Culture NO GROWTH 5 DAYS  Final   Report Status 12/18/2016 FINAL  Final  Blood Culture ID Panel (Reflexed)     Status: Abnormal   Collection Time: 12/13/16 10:58 AM  Result Value Ref Range Status   Enterococcus species NOT DETECTED NOT DETECTED Final   Listeria monocytogenes NOT DETECTED NOT DETECTED Final    Staphylococcus species DETECTED (A) NOT DETECTED Final    Comment: CRITICAL RESULT CALLED TO, READ BACK BY AND VERIFIED WITH: M WISE 12/14/16 @ 0945 M VESTAL    Staphylococcus aureus DETECTED (A) NOT DETECTED Final    Comment: Methicillin (oxacillin)-resistant Staphylococcus aureus (MRSA). MRSA is predictably resistant to beta-lactam antibiotics (except ceftaroline). Preferred therapy is vancomycin unless clinically contraindicated. Patient requires contact precautions if  hospitalized. CRITICAL RESULT CALLED TO, READ BACK BY AND VERIFIED WITH: M WISE 12/14/16 @ 0945 M VESTAL    Methicillin resistance DETECTED (A) NOT DETECTED Final    Comment: CRITICAL RESULT CALLED TO, READ BACK BY AND VERIFIED WITH: M WISE 12/14/16 @ 0945 M VESTAL    Streptococcus species NOT DETECTED NOT DETECTED Final   Streptococcus agalactiae NOT DETECTED NOT DETECTED Final   Streptococcus pneumoniae NOT DETECTED NOT DETECTED Final   Streptococcus pyogenes NOT DETECTED NOT DETECTED Final   Acinetobacter baumannii NOT DETECTED NOT DETECTED Final   Enterobacteriaceae species NOT DETECTED NOT DETECTED Final   Enterobacter cloacae complex NOT DETECTED NOT DETECTED Final   Escherichia coli NOT DETECTED NOT DETECTED Final   Klebsiella oxytoca NOT DETECTED NOT DETECTED Final   Klebsiella pneumoniae NOT DETECTED NOT DETECTED Final   Proteus species NOT DETECTED NOT DETECTED Final   Serratia marcescens NOT DETECTED NOT DETECTED Final   Haemophilus influenzae NOT DETECTED NOT DETECTED Final   Neisseria meningitidis NOT DETECTED NOT DETECTED Final   Pseudomonas aeruginosa NOT DETECTED NOT DETECTED Final   Candida albicans NOT DETECTED NOT DETECTED Final   Candida glabrata NOT DETECTED NOT DETECTED Final   Candida krusei NOT DETECTED NOT DETECTED Final   Candida parapsilosis NOT DETECTED NOT DETECTED Final   Candida tropicalis NOT DETECTED NOT DETECTED Final  Culture, blood (routine x 2)     Status: None   Collection Time:  12/18/16  4:43 PM  Result Value Ref Range Status   Specimen Description BLOOD LEFT ANTECUBITAL  Final   Special Requests   Final    BOTTLES DRAWN AEROBIC ONLY Blood Culture results may not be optimal due to an inadequate volume of blood received in culture bottles   Culture NO GROWTH 5 DAYS  Final   Report Status 12/23/2016 FINAL  Final  Culture, blood (routine x 2)     Status: None   Collection Time: 12/18/16  4:43 PM  Result Value Ref Range Status   Specimen Description BLOOD LEFT ANTECUBITAL  Final   Special Requests   Final    BOTTLES DRAWN AEROBIC ONLY Blood Culture results may not be optimal due to an inadequate volume of blood received in culture bottles   Culture NO GROWTH 5 DAYS  Final   Report Status 12/23/2016 FINAL  Final  Culture, blood (routine x 2)  Status: None (Preliminary result)   Collection Time: 12/22/16 11:55 AM  Result Value Ref Range Status   Specimen Description BLOOD RIGHT HAND  Final   Special Requests IN PEDIATRIC BOTTLE Blood Culture adequate volume  Final   Culture NO GROWTH 3 DAYS  Final   Report Status PENDING  Incomplete  Culture, blood (routine x 2)     Status: Abnormal   Collection Time: 12/22/16 11:55 AM  Result Value Ref Range Status   Specimen Description BLOOD RIGHT WRIST  Final   Special Requests IN PEDIATRIC BOTTLE Blood Culture adequate volume  Final   Culture  Setup Time   Final    GRAM POSITIVE COCCI IN CLUSTERS AEROBIC BOTTLE ONLY CRITICAL RESULT CALLED TO, READ BACK BY AND VERIFIED WITH: S CULLERS 12/23/16 @ 1822 M VESTAL    Culture (A)  Final    STAPHYLOCOCCUS SPECIES (COAGULASE NEGATIVE) THE SIGNIFICANCE OF ISOLATING THIS ORGANISM FROM A SINGLE SET OF BLOOD CULTURES WHEN MULTIPLE SETS ARE DRAWN IS UNCERTAIN. PLEASE NOTIFY THE MICROBIOLOGY DEPARTMENT WITHIN ONE WEEK IF SPECIATION AND SENSITIVITIES ARE REQUIRED.    Report Status 12/25/2016 FINAL  Final  Blood Culture ID Panel (Reflexed)     Status: None   Collection Time: 12/22/16  11:55 AM  Result Value Ref Range Status   Enterococcus species NOT DETECTED NOT DETECTED Final   Listeria monocytogenes NOT DETECTED NOT DETECTED Final   Staphylococcus species NOT DETECTED NOT DETECTED Final   Staphylococcus aureus NOT DETECTED NOT DETECTED Final   Streptococcus species NOT DETECTED NOT DETECTED Final   Streptococcus agalactiae NOT DETECTED NOT DETECTED Final   Streptococcus pneumoniae NOT DETECTED NOT DETECTED Final   Streptococcus pyogenes NOT DETECTED NOT DETECTED Final   Acinetobacter baumannii NOT DETECTED NOT DETECTED Final   Enterobacteriaceae species NOT DETECTED NOT DETECTED Final   Enterobacter cloacae complex NOT DETECTED NOT DETECTED Final   Escherichia coli NOT DETECTED NOT DETECTED Final   Klebsiella oxytoca NOT DETECTED NOT DETECTED Final   Klebsiella pneumoniae NOT DETECTED NOT DETECTED Final   Proteus species NOT DETECTED NOT DETECTED Final   Serratia marcescens NOT DETECTED NOT DETECTED Final   Haemophilus influenzae NOT DETECTED NOT DETECTED Final   Neisseria meningitidis NOT DETECTED NOT DETECTED Final   Pseudomonas aeruginosa NOT DETECTED NOT DETECTED Final   Candida albicans NOT DETECTED NOT DETECTED Final   Candida glabrata NOT DETECTED NOT DETECTED Final   Candida krusei NOT DETECTED NOT DETECTED Final   Candida parapsilosis NOT DETECTED NOT DETECTED Final   Candida tropicalis NOT DETECTED NOT DETECTED Final    Coagulation Studies: No results for input(s): LABPROT, INR in the last 72 hours.  Urinalysis: No results for input(s): COLORURINE, LABSPEC, PHURINE, GLUCOSEU, HGBUR, BILIRUBINUR, KETONESUR, PROTEINUR, UROBILINOGEN, NITRITE, LEUKOCYTESUR in the last 72 hours.  Invalid input(s): APPERANCEUR    Imaging: Dg Chest Port 1 View  Result Date: 12/24/2016 CLINICAL DATA:  71 year old male admitted to [facility] June for management of multiple comorbidities. Recent E coli bacteremia, cellulitis. Heart failure, atrial  fibrillation, chronic kidney disease. Being dialyzed. Fever. EXAM: PORTABLE CHEST 1 VIEW COMPARISON:  12/13/2016. FINDINGS: Portable AP semi upright view at 1355 hours. Right IJ dual-lumen dialysis type catheter remains in place. Stable mediastinal contour, with mild to moderate cardiomegaly. Visualized tracheal air column is within normal limits. Allowing for portable technique the lungs are clear. No pneumothorax or pleural effusion. IMPRESSION: 1.  No acute cardiopulmonary abnormality. 2. Cardiomegaly.  Right IJ dialysis catheter. Electronically Signed  By: Odessa Fleming M.D.   On: 12/24/2016 14:13     Medications:       Assessment/ Plan:  72 y.o. male with a PMHx of acute on chronic diastolic heart,acute renal failure with underlying chronic kidney disease stage III, recent Escherichia coli bacteremia with urinary tract infection, atrial fibrillation, history of acute pericardial effusion, chronic traumatic encephalopathy, diabetes mellitus type 2, morbid obesity, obstructive sleep apnea, hypertension, debility, chronic venous ulceration, and generalized edema, recent metabolic encephalopathy, h/o Chronic traumatic encephalopathy (CTE) foot ball injury; who was admitted [facility] for ongoing management.   1. Acute renal failure. 2. Chronic kidney disease stage 3 baseline Cr 2.0. Likely diabetic nephropathy 3. Secondary  Hyperparathyroidism. 4. Anemia of CKD. 5. Hyponatremia. 6. Chronic diastolic heart failure. 7. Lower extremity edema. 8. Hyperkalemia 9. Fever, sepsis- positive blood culture  Plan:  Patient has required dialysis since June 6. UOP remains poor with high BUN and creatinine. Seems like this patient will be dialysis dependent. He is approaching ESRD Agree with vein mapping and eventually vascular consult to see if he will be a candidate for access placement Next HD on Wednesday after temp dialysis cathter placement     Hemoglobin currently acceptable at  9.2 Serum phosphorus 3.5, acceptable Continue to monitor serum electrolytes, CBC, and bone metabolism parameters.  Further plan as patient progresses.     LOS: 0 Marieann Zipp 7/2/20184:22 PM

## 2016-12-27 LAB — RENAL FUNCTION PANEL
ANION GAP: 11 (ref 5–15)
Albumin: 2.2 g/dL — ABNORMAL LOW (ref 3.5–5.0)
BUN: 50 mg/dL — ABNORMAL HIGH (ref 6–20)
CALCIUM: 9.2 mg/dL (ref 8.9–10.3)
CO2: 24 mmol/L (ref 22–32)
Chloride: 96 mmol/L — ABNORMAL LOW (ref 101–111)
Creatinine, Ser: 3.12 mg/dL — ABNORMAL HIGH (ref 0.61–1.24)
GFR calc Af Amer: 22 mL/min — ABNORMAL LOW (ref >60)
GFR calc non Af Amer: 19 mL/min — ABNORMAL LOW (ref >60)
GLUCOSE: 124 mg/dL — AB (ref 65–99)
POTASSIUM: 3.6 mmol/L (ref 3.5–5.1)
Phosphorus: 3.2 mg/dL (ref 2.5–4.6)
SODIUM: 131 mmol/L — AB (ref 135–145)

## 2016-12-27 LAB — CBC
HCT: 30.1 % — ABNORMAL LOW (ref 39.0–52.0)
HEMOGLOBIN: 9.2 g/dL — AB (ref 13.0–17.0)
MCH: 28.2 pg (ref 26.0–34.0)
MCHC: 30.6 g/dL (ref 30.0–36.0)
MCV: 92.3 fL (ref 78.0–100.0)
Platelets: UNDETERMINED 10*3/uL (ref 150–400)
RBC: 3.26 MIL/uL — ABNORMAL LOW (ref 4.22–5.81)
RDW: 17.5 % — AB (ref 11.5–15.5)
WBC: 16 10*3/uL — ABNORMAL HIGH (ref 4.0–10.5)

## 2016-12-27 LAB — CULTURE, BLOOD (ROUTINE X 2)
Culture: NO GROWTH
Special Requests: ADEQUATE

## 2016-12-27 LAB — VANCOMYCIN, TROUGH: Vancomycin Tr: 22 ug/mL (ref 15–20)

## 2016-12-27 NOTE — Progress Notes (Signed)
[facility]  ROUNDING NOTE   Subjective:  Patient is doing fair No acute c/o S Creatinine is lower Last HD was on Friday  Objective:  Vital signs in last 24 hours:  Temperature 98.2 pulse 102 respirations 10 blood pressure 133/64  Physical Exam: General: No acute distress, obese  Head: Normocephalic, atraumatic. Moist oral mucosal membranes  Eyes: Anicteric  Neck: Supple,    Lungs:   normal effort, decreased breath sounds  Heart: S1S2 no rubs  Abdomen:  Soft, nontender,    Extremities: Chronic stasis dermatitis  Neurologic: Awake, alert, following commands      Access:      Basic Metabolic Panel:  Recent Labs Lab 12/21/16 0520 12/23/16 0510 12/27/16 0547  NA 131* 132* 131*  K 3.7 3.9 3.6  CL 95* 97* 96*  CO2 23 23 24   GLUCOSE 140* 110* 124*  BUN 45* 48* 50*  CREATININE 4.08* 4.76* 3.12*  CALCIUM 10.4* 9.8 9.2  PHOS 3.6 3.5 3.2    Liver Function Tests:  Recent Labs Lab 12/21/16 0520 12/23/16 0510 12/27/16 0547  ALBUMIN 2.5* 2.5* 2.2*   No results for input(s): LIPASE, AMYLASE in the last 168 hours. No results for input(s): AMMONIA in the last 168 hours.  CBC:  Recent Labs Lab 12/21/16 0520 12/23/16 0510 12/27/16 0547  WBC 14.5* 12.3* 16.0*  HGB 9.6* 9.2* 9.2*  HCT 31.9* 30.4* 30.1*  MCV 93.3 93.5 92.3  PLT 315 274 PLATELET CLUMPS NOTED ON SMEAR, UNABLE TO ESTIMATE    Cardiac Enzymes: No results for input(s): CKTOTAL, CKMB, CKMBINDEX, TROPONINI in the last 168 hours.  BNP: Invalid input(s): POCBNP  CBG: No results for input(s): GLUCAP in the last 168 hours.  Microbiology: Results for orders placed or performed during the hospital encounter of 12/08/16  Culture, Urine     Status: Abnormal   Collection Time: 12/11/16  4:07 PM  Result Value Ref Range Status   Specimen Description URINE, RANDOM  Final   Special Requests NONE  Final   Culture >=100,000 COLONIES/mL YEAST (A)  Final   Report Status 12/12/2016 FINAL  Final   Culture, blood (routine x 2)     Status: Abnormal   Collection Time: 12/13/16 10:58 AM  Result Value Ref Range Status   Specimen Description BLOOD LEFT ARM  Final   Special Requests IN PEDIATRIC BOTTLE Blood Culture adequate volume  Final   Culture  Setup Time   Final    GRAM POSITIVE COCCI IN CLUSTERS AEROBIC BOTTLE ONLY CRITICAL RESULT CALLED TO, READ BACK BY AND VERIFIED WITH: M WISE 12/14/16 @ 0945 M VESTAL    Culture (A)  Final    STAPHYLOCOCCUS AUREUS STAPHYLOCOCCUS SPECIES (COAGULASE NEGATIVE) THE SIGNIFICANCE OF ISOLATING THIS ORGANISM FROM A SINGLE SET OF BLOOD CULTURES WHEN MULTIPLE SETS ARE DRAWN IS UNCERTAIN. PLEASE NOTIFY THE MICROBIOLOGY DEPARTMENT WITHIN ONE WEEK IF SPECIATION AND SENSITIVITIES ARE REQUIRED.    Report Status 12/16/2016 FINAL  Final   Organism ID, Bacteria STAPHYLOCOCCUS AUREUS  Final      Susceptibility   Staphylococcus aureus - MIC*    CIPROFLOXACIN <=0.5 SENSITIVE Sensitive     ERYTHROMYCIN <=0.25 SENSITIVE Sensitive     GENTAMICIN <=0.5 SENSITIVE Sensitive     OXACILLIN 0.5 SENSITIVE Sensitive     TETRACYCLINE <=1 SENSITIVE Sensitive     VANCOMYCIN <=0.5 SENSITIVE Sensitive     TRIMETH/SULFA <=10 SENSITIVE Sensitive     CLINDAMYCIN <=0.25 SENSITIVE Sensitive     RIFAMPIN <=0.5 SENSITIVE Sensitive  Inducible Clindamycin NEGATIVE Sensitive     * STAPHYLOCOCCUS AUREUS  Culture, blood (routine x 2)     Status: None   Collection Time: 12/13/16 10:58 AM  Result Value Ref Range Status   Specimen Description BLOOD LEFT HAND  Final   Special Requests IN PEDIATRIC BOTTLE Blood Culture adequate volume  Final   Culture NO GROWTH 5 DAYS  Final   Report Status 12/18/2016 FINAL  Final  Blood Culture ID Panel (Reflexed)     Status: Abnormal   Collection Time: 12/13/16 10:58 AM  Result Value Ref Range Status   Enterococcus species NOT DETECTED NOT DETECTED Final   Listeria monocytogenes NOT DETECTED NOT DETECTED Final   Staphylococcus species DETECTED  (A) NOT DETECTED Final    Comment: CRITICAL RESULT CALLED TO, READ BACK BY AND VERIFIED WITH: M WISE 12/14/16 @ 0945 M VESTAL    Staphylococcus aureus DETECTED (A) NOT DETECTED Final    Comment: Methicillin (oxacillin)-resistant Staphylococcus aureus (MRSA). MRSA is predictably resistant to beta-lactam antibiotics (except ceftaroline). Preferred therapy is vancomycin unless clinically contraindicated. Patient requires contact precautions if  hospitalized. CRITICAL RESULT CALLED TO, READ BACK BY AND VERIFIED WITH: M WISE 12/14/16 @ 0945 M VESTAL    Methicillin resistance DETECTED (A) NOT DETECTED Final    Comment: CRITICAL RESULT CALLED TO, READ BACK BY AND VERIFIED WITH: M WISE 12/14/16 @ 0945 M VESTAL    Streptococcus species NOT DETECTED NOT DETECTED Final   Streptococcus agalactiae NOT DETECTED NOT DETECTED Final   Streptococcus pneumoniae NOT DETECTED NOT DETECTED Final   Streptococcus pyogenes NOT DETECTED NOT DETECTED Final   Acinetobacter baumannii NOT DETECTED NOT DETECTED Final   Enterobacteriaceae species NOT DETECTED NOT DETECTED Final   Enterobacter cloacae complex NOT DETECTED NOT DETECTED Final   Escherichia coli NOT DETECTED NOT DETECTED Final   Klebsiella oxytoca NOT DETECTED NOT DETECTED Final   Klebsiella pneumoniae NOT DETECTED NOT DETECTED Final   Proteus species NOT DETECTED NOT DETECTED Final   Serratia marcescens NOT DETECTED NOT DETECTED Final   Haemophilus influenzae NOT DETECTED NOT DETECTED Final   Neisseria meningitidis NOT DETECTED NOT DETECTED Final   Pseudomonas aeruginosa NOT DETECTED NOT DETECTED Final   Candida albicans NOT DETECTED NOT DETECTED Final   Candida glabrata NOT DETECTED NOT DETECTED Final   Candida krusei NOT DETECTED NOT DETECTED Final   Candida parapsilosis NOT DETECTED NOT DETECTED Final   Candida tropicalis NOT DETECTED NOT DETECTED Final  Culture, blood (routine x 2)     Status: None   Collection Time: 12/18/16  4:43 PM  Result  Value Ref Range Status   Specimen Description BLOOD LEFT ANTECUBITAL  Final   Special Requests   Final    BOTTLES DRAWN AEROBIC ONLY Blood Culture results may not be optimal due to an inadequate volume of blood received in culture bottles   Culture NO GROWTH 5 DAYS  Final   Report Status 12/23/2016 FINAL  Final  Culture, blood (routine x 2)     Status: None   Collection Time: 12/18/16  4:43 PM  Result Value Ref Range Status   Specimen Description BLOOD LEFT ANTECUBITAL  Final   Special Requests   Final    BOTTLES DRAWN AEROBIC ONLY Blood Culture results may not be optimal due to an inadequate volume of blood received in culture bottles   Culture NO GROWTH 5 DAYS  Final   Report Status 12/23/2016 FINAL  Final  Culture, blood (routine x 2)  Status: None (Preliminary result)   Collection Time: 12/22/16 11:55 AM  Result Value Ref Range Status   Specimen Description BLOOD RIGHT HAND  Final   Special Requests IN PEDIATRIC BOTTLE Blood Culture adequate volume  Final   Culture NO GROWTH 4 DAYS  Final   Report Status PENDING  Incomplete  Culture, blood (routine x 2)     Status: Abnormal   Collection Time: 12/22/16 11:55 AM  Result Value Ref Range Status   Specimen Description BLOOD RIGHT WRIST  Final   Special Requests IN PEDIATRIC BOTTLE Blood Culture adequate volume  Final   Culture  Setup Time   Final    GRAM POSITIVE COCCI IN CLUSTERS AEROBIC BOTTLE ONLY CRITICAL RESULT CALLED TO, READ BACK BY AND VERIFIED WITH: S CULLERS 12/23/16 @ 1822 M VESTAL    Culture (A)  Final    STAPHYLOCOCCUS SPECIES (COAGULASE NEGATIVE) THE SIGNIFICANCE OF ISOLATING THIS ORGANISM FROM A SINGLE SET OF BLOOD CULTURES WHEN MULTIPLE SETS ARE DRAWN IS UNCERTAIN. PLEASE NOTIFY THE MICROBIOLOGY DEPARTMENT WITHIN ONE WEEK IF SPECIATION AND SENSITIVITIES ARE REQUIRED.    Report Status 12/25/2016 FINAL  Final  Blood Culture ID Panel (Reflexed)     Status: None   Collection Time: 12/22/16 11:55 AM  Result Value Ref  Range Status   Enterococcus species NOT DETECTED NOT DETECTED Final   Listeria monocytogenes NOT DETECTED NOT DETECTED Final   Staphylococcus species NOT DETECTED NOT DETECTED Final   Staphylococcus aureus NOT DETECTED NOT DETECTED Final   Streptococcus species NOT DETECTED NOT DETECTED Final   Streptococcus agalactiae NOT DETECTED NOT DETECTED Final   Streptococcus pneumoniae NOT DETECTED NOT DETECTED Final   Streptococcus pyogenes NOT DETECTED NOT DETECTED Final   Acinetobacter baumannii NOT DETECTED NOT DETECTED Final   Enterobacteriaceae species NOT DETECTED NOT DETECTED Final   Enterobacter cloacae complex NOT DETECTED NOT DETECTED Final   Escherichia coli NOT DETECTED NOT DETECTED Final   Klebsiella oxytoca NOT DETECTED NOT DETECTED Final   Klebsiella pneumoniae NOT DETECTED NOT DETECTED Final   Proteus species NOT DETECTED NOT DETECTED Final   Serratia marcescens NOT DETECTED NOT DETECTED Final   Haemophilus influenzae NOT DETECTED NOT DETECTED Final   Neisseria meningitidis NOT DETECTED NOT DETECTED Final   Pseudomonas aeruginosa NOT DETECTED NOT DETECTED Final   Candida albicans NOT DETECTED NOT DETECTED Final   Candida glabrata NOT DETECTED NOT DETECTED Final   Candida krusei NOT DETECTED NOT DETECTED Final   Candida parapsilosis NOT DETECTED NOT DETECTED Final   Candida tropicalis NOT DETECTED NOT DETECTED Final    Coagulation Studies: No results for input(s): LABPROT, INR in the last 72 hours.  Urinalysis: No results for input(s): COLORURINE, LABSPEC, PHURINE, GLUCOSEU, HGBUR, BILIRUBINUR, KETONESUR, PROTEINUR, UROBILINOGEN, NITRITE, LEUKOCYTESUR in the last 72 hours.  Invalid input(s): APPERANCEUR    Imaging: Ir Removal Tun Cv Cath W/o Fl  Result Date: 12/25/2016 CLINICAL DATA:  Right tunneled IJ tip hemodialysis catheter, worked well, no longer needed. Removal requested. EXAM: TUNNELED HEMODIALYSIS CATHETER REMOVAL TECHNIQUE: Overlying skin prepped with  chlorhexidine, draped in usual sterile fashion, infiltrated locally with 1% lidocaine. The previously placed right IJ hemodialysis catheter was dissected free from the underlying soft tissues and removed intact. Hemostasis was achieved. Site covered with a sterile dressing. The patient tolerated the procedure well. COMPLICATIONS: none IMPRESSION: 1. Technically successful tunneled hemodialysis catheter removal. Electronically Signed   By: Corlis Leak M.D.   On: 12/25/2016 16:20     Medications:  Assessment/ Plan:  71 y.o. male with a PMHx of acute on chronic diastolic heart,acute renal failure with underlying chronic kidney disease stage III, recent Escherichia coli bacteremia with urinary tract infection, atrial fibrillation, history of acute pericardial effusion, chronic traumatic encephalopathy, diabetes mellitus type 2, morbid obesity, obstructive sleep apnea, hypertension, debility, chronic venous ulceration, and generalized edema, recent metabolic encephalopathy, h/o Chronic traumatic encephalopathy (CTE) foot ball injury; who was admitted [facility] for ongoing management.   1. Acute renal failure. 2. Chronic kidney disease stage 3 baseline Cr 2.0. Likely diabetic nephropathy 3. Secondary  Hyperparathyroidism. pTH 159 (12/12/16) 4. Anemia of CKD. 5. Hyponatremia. 6. Chronic diastolic heart failure. 7. Lower extremity edema. 8. Hyperkalemia 9. Fever, sepsis- positive blood culture  Plan:  Patient has required dialysis since June 6.  S Creatinine and electrolytes are acceptable Hold off dialysis cathter placement for now Will monitor BMP daily    Hemoglobin currently acceptable at 9.2 Serum phosphorus 3.2, acceptable. Continued on sevelamer Continue to monitor serum electrolytes, CBC, and bone metabolism parameters.  Further plan as patient progresses.     LOS: 0 Jamie Kaiser 7/4/20189:16 AM

## 2016-12-28 LAB — BASIC METABOLIC PANEL
Anion gap: 11 (ref 5–15)
BUN: 58 mg/dL — ABNORMAL HIGH (ref 6–20)
CALCIUM: 9.3 mg/dL (ref 8.9–10.3)
CHLORIDE: 98 mmol/L — AB (ref 101–111)
CO2: 24 mmol/L (ref 22–32)
CREATININE: 3.09 mg/dL — AB (ref 0.61–1.24)
GFR calc Af Amer: 22 mL/min — ABNORMAL LOW (ref >60)
GFR calc non Af Amer: 19 mL/min — ABNORMAL LOW (ref >60)
GLUCOSE: 108 mg/dL — AB (ref 65–99)
Potassium: 3.8 mmol/L (ref 3.5–5.1)
Sodium: 133 mmol/L — ABNORMAL LOW (ref 135–145)

## 2016-12-29 LAB — BASIC METABOLIC PANEL
ANION GAP: 10 (ref 5–15)
BUN: 62 mg/dL — AB (ref 6–20)
CALCIUM: 9.1 mg/dL (ref 8.9–10.3)
CO2: 23 mmol/L (ref 22–32)
Chloride: 98 mmol/L — ABNORMAL LOW (ref 101–111)
Creatinine, Ser: 3.18 mg/dL — ABNORMAL HIGH (ref 0.61–1.24)
GFR calc Af Amer: 21 mL/min — ABNORMAL LOW (ref >60)
GFR calc non Af Amer: 18 mL/min — ABNORMAL LOW (ref >60)
GLUCOSE: 121 mg/dL — AB (ref 65–99)
Potassium: 4 mmol/L (ref 3.5–5.1)
Sodium: 131 mmol/L — ABNORMAL LOW (ref 135–145)

## 2016-12-29 NOTE — Progress Notes (Signed)
[facility]  ROUNDING NOTE   Subjective:  Patient is doing fair No acute c/o S Creatinine is stabilizing without dialysis Last HD was on Friday 6/29  Objective:  Vital signs in last 24 hours:  Temperature 97.2 pulse 112 respirations 15 blood pressure 115/71  Physical Exam: General: No acute distress, obese  Head: Normocephalic, atraumatic. Moist oral mucosal membranes  Eyes: Anicteric  Neck: Supple,    Lungs:   normal effort, decreased breath sounds, O2 by Brier  Heart: S1S2 no rubs  Abdomen:  Soft, nontender,    Extremities: Chronic stasis dermatitis  Neurologic: Awake, alert, following commands      Access:      Basic Metabolic Panel:  Recent Labs Lab 12/23/16 0510 12/27/16 0547 12/28/16 0547 12/29/16 0610  NA 132* 131* 133* 131*  K 3.9 3.6 3.8 4.0  CL 97* 96* 98* 98*  CO2 23 24 24 23   GLUCOSE 110* 124* 108* 121*  BUN 48* 50* 58* 62*  CREATININE 4.76* 3.12* 3.09* 3.18*  CALCIUM 9.8 9.2 9.3 9.1  PHOS 3.5 3.2  --   --     Liver Function Tests:  Recent Labs Lab 12/23/16 0510 12/27/16 0547  ALBUMIN 2.5* 2.2*   No results for input(s): LIPASE, AMYLASE in the last 168 hours. No results for input(s): AMMONIA in the last 168 hours.  CBC:  Recent Labs Lab 12/23/16 0510 12/27/16 0547  WBC 12.3* 16.0*  HGB 9.2* 9.2*  HCT 30.4* 30.1*  MCV 93.5 92.3  PLT 274 PLATELET CLUMPS NOTED ON SMEAR, UNABLE TO ESTIMATE    Cardiac Enzymes: No results for input(s): CKTOTAL, CKMB, CKMBINDEX, TROPONINI in the last 168 hours.  BNP: Invalid input(s): POCBNP  CBG: No results for input(s): GLUCAP in the last 168 hours.  Microbiology: Results for orders placed or performed during the hospital encounter of 12/08/16  Culture, Urine     Status: Abnormal   Collection Time: 12/11/16  4:07 PM  Result Value Ref Range Status   Specimen Description URINE, RANDOM  Final   Special Requests NONE  Final   Culture >=100,000 COLONIES/mL YEAST (A)  Final   Report  Status 12/12/2016 FINAL  Final  Culture, blood (routine x 2)     Status: Abnormal   Collection Time: 12/13/16 10:58 AM  Result Value Ref Range Status   Specimen Description BLOOD LEFT ARM  Final   Special Requests IN PEDIATRIC BOTTLE Blood Culture adequate volume  Final   Culture  Setup Time   Final    GRAM POSITIVE COCCI IN CLUSTERS AEROBIC BOTTLE ONLY CRITICAL RESULT CALLED TO, READ BACK BY AND VERIFIED WITH: M WISE 12/14/16 @ 0945 M VESTAL    Culture (A)  Final    STAPHYLOCOCCUS AUREUS STAPHYLOCOCCUS SPECIES (COAGULASE NEGATIVE) THE SIGNIFICANCE OF ISOLATING THIS ORGANISM FROM A SINGLE SET OF BLOOD CULTURES WHEN MULTIPLE SETS ARE DRAWN IS UNCERTAIN. PLEASE NOTIFY THE MICROBIOLOGY DEPARTMENT WITHIN ONE WEEK IF SPECIATION AND SENSITIVITIES ARE REQUIRED.    Report Status 12/16/2016 FINAL  Final   Organism ID, Bacteria STAPHYLOCOCCUS AUREUS  Final      Susceptibility   Staphylococcus aureus - MIC*    CIPROFLOXACIN <=0.5 SENSITIVE Sensitive     ERYTHROMYCIN <=0.25 SENSITIVE Sensitive     GENTAMICIN <=0.5 SENSITIVE Sensitive     OXACILLIN 0.5 SENSITIVE Sensitive     TETRACYCLINE <=1 SENSITIVE Sensitive     VANCOMYCIN <=0.5 SENSITIVE Sensitive     TRIMETH/SULFA <=10 SENSITIVE Sensitive     CLINDAMYCIN <=0.25 SENSITIVE  Sensitive     RIFAMPIN <=0.5 SENSITIVE Sensitive     Inducible Clindamycin NEGATIVE Sensitive     * STAPHYLOCOCCUS AUREUS  Culture, blood (routine x 2)     Status: None   Collection Time: 12/13/16 10:58 AM  Result Value Ref Range Status   Specimen Description BLOOD LEFT HAND  Final   Special Requests IN PEDIATRIC BOTTLE Blood Culture adequate volume  Final   Culture NO GROWTH 5 DAYS  Final   Report Status 12/18/2016 FINAL  Final  Blood Culture ID Panel (Reflexed)     Status: Abnormal   Collection Time: 12/13/16 10:58 AM  Result Value Ref Range Status   Enterococcus species NOT DETECTED NOT DETECTED Final   Listeria monocytogenes NOT DETECTED NOT DETECTED Final    Staphylococcus species DETECTED (A) NOT DETECTED Final    Comment: CRITICAL RESULT CALLED TO, READ BACK BY AND VERIFIED WITH: M WISE 12/14/16 @ 0945 M VESTAL    Staphylococcus aureus DETECTED (A) NOT DETECTED Final    Comment: Methicillin (oxacillin)-resistant Staphylococcus aureus (MRSA). MRSA is predictably resistant to beta-lactam antibiotics (except ceftaroline). Preferred therapy is vancomycin unless clinically contraindicated. Patient requires contact precautions if  hospitalized. CRITICAL RESULT CALLED TO, READ BACK BY AND VERIFIED WITH: M WISE 12/14/16 @ 0945 M VESTAL    Methicillin resistance DETECTED (A) NOT DETECTED Final    Comment: CRITICAL RESULT CALLED TO, READ BACK BY AND VERIFIED WITH: M WISE 12/14/16 @ 0945 M VESTAL    Streptococcus species NOT DETECTED NOT DETECTED Final   Streptococcus agalactiae NOT DETECTED NOT DETECTED Final   Streptococcus pneumoniae NOT DETECTED NOT DETECTED Final   Streptococcus pyogenes NOT DETECTED NOT DETECTED Final   Acinetobacter baumannii NOT DETECTED NOT DETECTED Final   Enterobacteriaceae species NOT DETECTED NOT DETECTED Final   Enterobacter cloacae complex NOT DETECTED NOT DETECTED Final   Escherichia coli NOT DETECTED NOT DETECTED Final   Klebsiella oxytoca NOT DETECTED NOT DETECTED Final   Klebsiella pneumoniae NOT DETECTED NOT DETECTED Final   Proteus species NOT DETECTED NOT DETECTED Final   Serratia marcescens NOT DETECTED NOT DETECTED Final   Haemophilus influenzae NOT DETECTED NOT DETECTED Final   Neisseria meningitidis NOT DETECTED NOT DETECTED Final   Pseudomonas aeruginosa NOT DETECTED NOT DETECTED Final   Candida albicans NOT DETECTED NOT DETECTED Final   Candida glabrata NOT DETECTED NOT DETECTED Final   Candida krusei NOT DETECTED NOT DETECTED Final   Candida parapsilosis NOT DETECTED NOT DETECTED Final   Candida tropicalis NOT DETECTED NOT DETECTED Final  Culture, blood (routine x 2)     Status: None   Collection Time:  12/18/16  4:43 PM  Result Value Ref Range Status   Specimen Description BLOOD LEFT ANTECUBITAL  Final   Special Requests   Final    BOTTLES DRAWN AEROBIC ONLY Blood Culture results may not be optimal due to an inadequate volume of blood received in culture bottles   Culture NO GROWTH 5 DAYS  Final   Report Status 12/23/2016 FINAL  Final  Culture, blood (routine x 2)     Status: None   Collection Time: 12/18/16  4:43 PM  Result Value Ref Range Status   Specimen Description BLOOD LEFT ANTECUBITAL  Final   Special Requests   Final    BOTTLES DRAWN AEROBIC ONLY Blood Culture results may not be optimal due to an inadequate volume of blood received in culture bottles   Culture NO GROWTH 5 DAYS  Final   Report Status 12/23/2016  FINAL  Final  Culture, blood (routine x 2)     Status: None   Collection Time: 12/22/16 11:55 AM  Result Value Ref Range Status   Specimen Description BLOOD RIGHT HAND  Final   Special Requests IN PEDIATRIC BOTTLE Blood Culture adequate volume  Final   Culture NO GROWTH 5 DAYS  Final   Report Status 12/27/2016 FINAL  Final  Culture, blood (routine x 2)     Status: Abnormal   Collection Time: 12/22/16 11:55 AM  Result Value Ref Range Status   Specimen Description BLOOD RIGHT WRIST  Final   Special Requests IN PEDIATRIC BOTTLE Blood Culture adequate volume  Final   Culture  Setup Time   Final    GRAM POSITIVE COCCI IN CLUSTERS AEROBIC BOTTLE ONLY CRITICAL RESULT CALLED TO, READ BACK BY AND VERIFIED WITH: S CULLERS 12/23/16 @ 1822 M VESTAL    Culture (A)  Final    STAPHYLOCOCCUS SPECIES (COAGULASE NEGATIVE) THE SIGNIFICANCE OF ISOLATING THIS ORGANISM FROM A SINGLE SET OF BLOOD CULTURES WHEN MULTIPLE SETS ARE DRAWN IS UNCERTAIN. PLEASE NOTIFY THE MICROBIOLOGY DEPARTMENT WITHIN ONE WEEK IF SPECIATION AND SENSITIVITIES ARE REQUIRED.    Report Status 12/25/2016 FINAL  Final  Blood Culture ID Panel (Reflexed)     Status: None   Collection Time: 12/22/16 11:55 AM  Result  Value Ref Range Status   Enterococcus species NOT DETECTED NOT DETECTED Final   Listeria monocytogenes NOT DETECTED NOT DETECTED Final   Staphylococcus species NOT DETECTED NOT DETECTED Final   Staphylococcus aureus NOT DETECTED NOT DETECTED Final   Streptococcus species NOT DETECTED NOT DETECTED Final   Streptococcus agalactiae NOT DETECTED NOT DETECTED Final   Streptococcus pneumoniae NOT DETECTED NOT DETECTED Final   Streptococcus pyogenes NOT DETECTED NOT DETECTED Final   Acinetobacter baumannii NOT DETECTED NOT DETECTED Final   Enterobacteriaceae species NOT DETECTED NOT DETECTED Final   Enterobacter cloacae complex NOT DETECTED NOT DETECTED Final   Escherichia coli NOT DETECTED NOT DETECTED Final   Klebsiella oxytoca NOT DETECTED NOT DETECTED Final   Klebsiella pneumoniae NOT DETECTED NOT DETECTED Final   Proteus species NOT DETECTED NOT DETECTED Final   Serratia marcescens NOT DETECTED NOT DETECTED Final   Haemophilus influenzae NOT DETECTED NOT DETECTED Final   Neisseria meningitidis NOT DETECTED NOT DETECTED Final   Pseudomonas aeruginosa NOT DETECTED NOT DETECTED Final   Candida albicans NOT DETECTED NOT DETECTED Final   Candida glabrata NOT DETECTED NOT DETECTED Final   Candida krusei NOT DETECTED NOT DETECTED Final   Candida parapsilosis NOT DETECTED NOT DETECTED Final   Candida tropicalis NOT DETECTED NOT DETECTED Final    Coagulation Studies: No results for input(s): LABPROT, INR in the last 72 hours.  Urinalysis: No results for input(s): COLORURINE, LABSPEC, PHURINE, GLUCOSEU, HGBUR, BILIRUBINUR, KETONESUR, PROTEINUR, UROBILINOGEN, NITRITE, LEUKOCYTESUR in the last 72 hours.  Invalid input(s): APPERANCEUR    Imaging: No results found.   Medications:       Assessment/ Plan:  71 y.o. male with a PMHx of acute on chronic diastolic heart,acute renal failure with underlying chronic kidney disease stage III, recent Escherichia coli bacteremia with urinary tract  infection, atrial fibrillation, history of acute pericardial effusion, chronic traumatic encephalopathy, diabetes mellitus type 2, morbid obesity, obstructive sleep apnea, hypertension, debility, chronic venous ulceration, and generalized edema, recent metabolic encephalopathy, h/o Chronic traumatic encephalopathy (CTE) foot ball injury; who was admitted [facility] for ongoing management.   1. Acute renal failure. 2. Chronic kidney disease stage  3 baseline Cr 2.0. Likely diabetic nephropathy 3. Secondary  Hyperparathyroidism. pTH 159 (12/12/16) 4. Anemia of CKD. 5. Hyponatremia. 6. Chronic diastolic heart failure. 7. Lower extremity edema. 8. Hyperkalemia 9. Fever, sepsis- positive blood culture  Plan:  Patient has required dialysis since June 6.  S Creatinine and electrolytes are stable without dialysis. Patient may have established a new baseline. Hold off dialysis cathter placement for now Will monitor BMP closely    Hemoglobin currently acceptable at 9.2 Serum phosphorus 3.2, acceptable. Continued on sevelamer Continue to monitor serum electrolytes, CBC, and bone metabolism parameters.   Further plan as patient progresses.     LOS: 0 Khaled Herda 7/6/20183:45 PM

## 2016-12-30 LAB — VANCOMYCIN, TROUGH: VANCOMYCIN TR: 16 ug/mL (ref 15–20)

## 2017-01-01 LAB — RENAL FUNCTION PANEL
ANION GAP: 12 (ref 5–15)
Albumin: 2 g/dL — ABNORMAL LOW (ref 3.5–5.0)
BUN: 75 mg/dL — ABNORMAL HIGH (ref 6–20)
CHLORIDE: 101 mmol/L (ref 101–111)
CO2: 22 mmol/L (ref 22–32)
CREATININE: 3.06 mg/dL — AB (ref 0.61–1.24)
Calcium: 9.1 mg/dL (ref 8.9–10.3)
GFR, EST AFRICAN AMERICAN: 22 mL/min — AB (ref >60)
GFR, EST NON AFRICAN AMERICAN: 19 mL/min — AB (ref >60)
Glucose, Bld: 112 mg/dL — ABNORMAL HIGH (ref 65–99)
POTASSIUM: 5 mmol/L (ref 3.5–5.1)
Phosphorus: 5.5 mg/dL — ABNORMAL HIGH (ref 2.5–4.6)
Sodium: 135 mmol/L (ref 135–145)

## 2017-01-01 NOTE — Progress Notes (Signed)
[facility]  ROUNDING NOTE   Subjective:  The patient's last dialysis was on 12/22/2016. reatinine today is 3.06 with BUN of 75. Overall the patient's renal function appears to be stable.  Objective:  Vital signs in last 24 hours:  Temperature 97.6 pulse 103 respirations 17 blood pressure 143/92  Physical Exam: General: No acute distress, obese  Head: Normocephalic, atraumatic. Moist oral mucosal membranes  Eyes: Anicteric  Neck: Supple  Lungs:  normal effort, decreased breath sounds  Heart: S1S2 no rubs  Abdomen:  Soft, nontender  Extremities: Chronic stasis dermatitis  Neurologic: Awake, alert, following commands            Basic Metabolic Panel:  Recent Labs Lab 12/27/16 0547 12/28/16 0547 12/29/16 0610 01/01/17 0654  NA 131* 133* 131* 135  K 3.6 3.8 4.0 5.0  CL 96* 98* 98* 101  CO2 24 24 23 22   GLUCOSE 124* 108* 121* 112*  BUN 50* 58* 62* 75*  CREATININE 3.12* 3.09* 3.18* 3.06*  CALCIUM 9.2 9.3 9.1 9.1  PHOS 3.2  --   --  5.5*    Liver Function Tests:  Recent Labs Lab 12/27/16 0547 01/01/17 0654  ALBUMIN 2.2* 2.0*   No results for input(s): LIPASE, AMYLASE in the last 168 hours. No results for input(s): AMMONIA in the last 168 hours.  CBC:  Recent Labs Lab 12/27/16 0547  WBC 16.0*  HGB 9.2*  HCT 30.1*  MCV 92.3  PLT PLATELET CLUMPS NOTED ON SMEAR, UNABLE TO ESTIMATE    Cardiac Enzymes: No results for input(s): CKTOTAL, CKMB, CKMBINDEX, TROPONINI in the last 168 hours.  BNP: Invalid input(s): POCBNP  CBG: No results for input(s): GLUCAP in the last 168 hours.  Microbiology: Results for orders placed or performed during the hospital encounter of 12/08/16  Culture, Urine     Status: Abnormal   Collection Time: 12/11/16  4:07 PM  Result Value Ref Range Status   Specimen Description URINE, RANDOM  Final   Special Requests NONE  Final   Culture >=100,000 COLONIES/mL YEAST (A)  Final   Report Status 12/12/2016 FINAL   Final  Culture, blood (routine x 2)     Status: Abnormal   Collection Time: 12/13/16 10:58 AM  Result Value Ref Range Status   Specimen Description BLOOD LEFT ARM  Final   Special Requests IN PEDIATRIC BOTTLE Blood Culture adequate volume  Final   Culture  Setup Time   Final    GRAM POSITIVE COCCI IN CLUSTERS AEROBIC BOTTLE ONLY CRITICAL RESULT CALLED TO, READ BACK BY AND VERIFIED WITH: M WISE 12/14/16 @ 35 M VESTAL    Culture (A)  Final    STAPHYLOCOCCUS AUREUS STAPHYLOCOCCUS SPECIES (COAGULASE NEGATIVE) THE SIGNIFICANCE OF ISOLATING THIS ORGANISM FROM A SINGLE SET OF BLOOD CULTURES WHEN MULTIPLE SETS ARE DRAWN IS UNCERTAIN. PLEASE NOTIFY THE MICROBIOLOGY DEPARTMENT WITHIN ONE WEEK IF SPECIATION AND SENSITIVITIES ARE REQUIRED.    Report Status 12/16/2016 FINAL  Final   Organism ID, Bacteria STAPHYLOCOCCUS AUREUS  Final      Susceptibility   Staphylococcus aureus - MIC*    CIPROFLOXACIN <=0.5 SENSITIVE Sensitive     ERYTHROMYCIN <=0.25 SENSITIVE Sensitive     GENTAMICIN <=0.5 SENSITIVE Sensitive     OXACILLIN 0.5 SENSITIVE Sensitive     TETRACYCLINE <=1 SENSITIVE Sensitive     VANCOMYCIN <=0.5 SENSITIVE Sensitive     TRIMETH/SULFA <=10 SENSITIVE Sensitive     CLINDAMYCIN <=0.25 SENSITIVE Sensitive     RIFAMPIN <=0.5 SENSITIVE Sensitive  Inducible Clindamycin NEGATIVE Sensitive     * STAPHYLOCOCCUS AUREUS  Culture, blood (routine x 2)     Status: None   Collection Time: 12/13/16 10:58 AM  Result Value Ref Range Status   Specimen Description BLOOD LEFT HAND  Final   Special Requests IN PEDIATRIC BOTTLE Blood Culture adequate volume  Final   Culture NO GROWTH 5 DAYS  Final   Report Status 12/18/2016 FINAL  Final  Blood Culture ID Panel (Reflexed)     Status: Abnormal   Collection Time: 12/13/16 10:58 AM  Result Value Ref Range Status   Enterococcus species NOT DETECTED NOT DETECTED Final   Listeria monocytogenes NOT DETECTED NOT DETECTED Final   Staphylococcus species  DETECTED (A) NOT DETECTED Final    Comment: CRITICAL RESULT CALLED TO, READ BACK BY AND VERIFIED WITH: M WISE 12/14/16 @ 53 M VESTAL    Staphylococcus aureus DETECTED (A) NOT DETECTED Final    Comment: Methicillin (oxacillin)-resistant Staphylococcus aureus (MRSA). MRSA is predictably resistant to beta-lactam antibiotics (except ceftaroline). Preferred therapy is vancomycin unless clinically contraindicated. Patient requires contact precautions if  hospitalized. CRITICAL RESULT CALLED TO, READ BACK BY AND VERIFIED WITH: M WISE 12/14/16 @ 88 M VESTAL    Methicillin resistance DETECTED (A) NOT DETECTED Final    Comment: CRITICAL RESULT CALLED TO, READ BACK BY AND VERIFIED WITH: M WISE 12/14/16 @ 6 M VESTAL    Streptococcus species NOT DETECTED NOT DETECTED Final   Streptococcus agalactiae NOT DETECTED NOT DETECTED Final   Streptococcus pneumoniae NOT DETECTED NOT DETECTED Final   Streptococcus pyogenes NOT DETECTED NOT DETECTED Final   Acinetobacter baumannii NOT DETECTED NOT DETECTED Final   Enterobacteriaceae species NOT DETECTED NOT DETECTED Final   Enterobacter cloacae complex NOT DETECTED NOT DETECTED Final   Escherichia coli NOT DETECTED NOT DETECTED Final   Klebsiella oxytoca NOT DETECTED NOT DETECTED Final   Klebsiella pneumoniae NOT DETECTED NOT DETECTED Final   Proteus species NOT DETECTED NOT DETECTED Final   Serratia marcescens NOT DETECTED NOT DETECTED Final   Haemophilus influenzae NOT DETECTED NOT DETECTED Final   Neisseria meningitidis NOT DETECTED NOT DETECTED Final   Pseudomonas aeruginosa NOT DETECTED NOT DETECTED Final   Candida albicans NOT DETECTED NOT DETECTED Final   Candida glabrata NOT DETECTED NOT DETECTED Final   Candida krusei NOT DETECTED NOT DETECTED Final   Candida parapsilosis NOT DETECTED NOT DETECTED Final   Candida tropicalis NOT DETECTED NOT DETECTED Final  Culture, blood (routine x 2)     Status: None   Collection Time: 12/18/16  4:43 PM   Result Value Ref Range Status   Specimen Description BLOOD LEFT ANTECUBITAL  Final   Special Requests   Final    BOTTLES DRAWN AEROBIC ONLY Blood Culture results may not be optimal due to an inadequate volume of blood received in culture bottles   Culture NO GROWTH 5 DAYS  Final   Report Status 12/23/2016 FINAL  Final  Culture, blood (routine x 2)     Status: None   Collection Time: 12/18/16  4:43 PM  Result Value Ref Range Status   Specimen Description BLOOD LEFT ANTECUBITAL  Final   Special Requests   Final    BOTTLES DRAWN AEROBIC ONLY Blood Culture results may not be optimal due to an inadequate volume of blood received in culture bottles   Culture NO GROWTH 5 DAYS  Final   Report Status 12/23/2016 FINAL  Final  Culture, blood (routine x 2)  Status: None   Collection Time: 12/22/16 11:55 AM  Result Value Ref Range Status   Specimen Description BLOOD RIGHT HAND  Final   Special Requests IN PEDIATRIC BOTTLE Blood Culture adequate volume  Final   Culture NO GROWTH 5 DAYS  Final   Report Status 12/27/2016 FINAL  Final  Culture, blood (routine x 2)     Status: Abnormal   Collection Time: 12/22/16 11:55 AM  Result Value Ref Range Status   Specimen Description BLOOD RIGHT WRIST  Final   Special Requests IN PEDIATRIC BOTTLE Blood Culture adequate volume  Final   Culture  Setup Time   Final    GRAM POSITIVE COCCI IN CLUSTERS AEROBIC BOTTLE ONLY CRITICAL RESULT CALLED TO, READ BACK BY AND VERIFIED WITH: S [place] 12/23/16 @ 15 M VESTAL    Culture (A)  Final    STAPHYLOCOCCUS SPECIES (COAGULASE NEGATIVE) THE SIGNIFICANCE OF ISOLATING THIS ORGANISM FROM A SINGLE SET OF BLOOD CULTURES WHEN MULTIPLE SETS ARE DRAWN IS UNCERTAIN. PLEASE NOTIFY THE MICROBIOLOGY DEPARTMENT WITHIN ONE WEEK IF SPECIATION AND SENSITIVITIES ARE REQUIRED.    Report Status 12/25/2016 FINAL  Final  Blood Culture ID Panel (Reflexed)     Status: None   Collection Time: 12/22/16 11:55 AM  Result Value Ref Range  Status   Enterococcus species NOT DETECTED NOT DETECTED Final   Listeria monocytogenes NOT DETECTED NOT DETECTED Final   Staphylococcus species NOT DETECTED NOT DETECTED Final   Staphylococcus aureus NOT DETECTED NOT DETECTED Final   Streptococcus species NOT DETECTED NOT DETECTED Final   Streptococcus agalactiae NOT DETECTED NOT DETECTED Final   Streptococcus pneumoniae NOT DETECTED NOT DETECTED Final   Streptococcus pyogenes NOT DETECTED NOT DETECTED Final   Acinetobacter baumannii NOT DETECTED NOT DETECTED Final   Enterobacteriaceae species NOT DETECTED NOT DETECTED Final   Enterobacter cloacae complex NOT DETECTED NOT DETECTED Final   Escherichia coli NOT DETECTED NOT DETECTED Final   Klebsiella oxytoca NOT DETECTED NOT DETECTED Final   Klebsiella pneumoniae NOT DETECTED NOT DETECTED Final   Proteus species NOT DETECTED NOT DETECTED Final   Serratia marcescens NOT DETECTED NOT DETECTED Final   Haemophilus influenzae NOT DETECTED NOT DETECTED Final   Neisseria meningitidis NOT DETECTED NOT DETECTED Final   Pseudomonas aeruginosa NOT DETECTED NOT DETECTED Final   Candida albicans NOT DETECTED NOT DETECTED Final   Candida glabrata NOT DETECTED NOT DETECTED Final   Candida krusei NOT DETECTED NOT DETECTED Final   Candida parapsilosis NOT DETECTED NOT DETECTED Final   Candida tropicalis NOT DETECTED NOT DETECTED Final    Coagulation Studies: No results for input(s): LABPROT, INR in the last 72 hours.  Urinalysis: No results for input(s): COLORURINE, LABSPEC, PHURINE, GLUCOSEU, HGBUR, BILIRUBINUR, KETONESUR, PROTEINUR, UROBILINOGEN, NITRITE, LEUKOCYTESUR in the last 72 hours.  Invalid input(s): APPERANCEUR    Imaging: No results found.   Medications:       Assessment/ Plan:  71 y.o. male with a PMHx of acute on chronic diastolic heart,acute renal failure with underlying chronic kidney disease stage III, recent Escherichia coli bacteremia with urinary tract infection,  atrial fibrillation, history of acute pericardial effusion, chronic traumatic encephalopathy, diabetes mellitus type 2, morbid obesity, obstructive sleep apnea, hypertension, debility, chronic venous ulceration, and generalized edema, recent metabolic encephalopathy, h/o Chronic traumatic encephalopathy (CTE) foot ball injury; who was admitted [facility] for ongoing management.   1. Acute renal failure, dialysis stopped 12/22/16. 2. Chronic kidney disease stage 3 baseline Cr 2.0. Likely diabetic nephropathy 3. Secondary  Hyperparathyroidism. PTH 159 (12/12/16) 4. Anemia of CKD. 5. Hyponatremia. 6. Chronic diastolic heart failure. 7. Lower extremity edema. 8. Hyperkalemia 9. Fever, sepsis- positive blood culture  Plan:  It appears that the patient has developed a new baseline creatinine. Current creatinine is 3.06 with an EGFR of 19. Therefore no urgent indication for dialysis at the moment. Recommend continue periodic monitoring of renal function. Patient also has associated anemia chronic kidney disease with most recent hemoglobin of 9.2. He was a monitoring of this long-term as well. We will continue to monitor the patient's progress along with you.     LOS: 0 Guida Asman 7/9/20183:42 PM

## 2017-01-03 LAB — RENAL FUNCTION PANEL
ANION GAP: 11 (ref 5–15)
Albumin: 1.9 g/dL — ABNORMAL LOW (ref 3.5–5.0)
BUN: 62 mg/dL — ABNORMAL HIGH (ref 6–20)
CALCIUM: 9.1 mg/dL (ref 8.9–10.3)
CHLORIDE: 101 mmol/L (ref 101–111)
CO2: 24 mmol/L (ref 22–32)
Creatinine, Ser: 2.37 mg/dL — ABNORMAL HIGH (ref 0.61–1.24)
GFR calc non Af Amer: 26 mL/min — ABNORMAL LOW (ref >60)
GFR, EST AFRICAN AMERICAN: 31 mL/min — AB (ref >60)
Glucose, Bld: 121 mg/dL — ABNORMAL HIGH (ref 65–99)
Phosphorus: 3.7 mg/dL (ref 2.5–4.6)
Potassium: 3.9 mmol/L (ref 3.5–5.1)
Sodium: 136 mmol/L (ref 135–145)

## 2017-01-03 LAB — CBC
HEMATOCRIT: 28.9 % — AB (ref 39.0–52.0)
HEMOGLOBIN: 8.8 g/dL — AB (ref 13.0–17.0)
MCH: 28.5 pg (ref 26.0–34.0)
MCHC: 30.4 g/dL (ref 30.0–36.0)
MCV: 93.5 fL (ref 78.0–100.0)
Platelets: 413 10*3/uL — ABNORMAL HIGH (ref 150–400)
RBC: 3.09 MIL/uL — ABNORMAL LOW (ref 4.22–5.81)
RDW: 17.8 % — ABNORMAL HIGH (ref 11.5–15.5)
WBC: 14.4 10*3/uL — ABNORMAL HIGH (ref 4.0–10.5)

## 2017-01-03 LAB — MAGNESIUM: Magnesium: 1.9 mg/dL (ref 1.7–2.4)

## 2017-01-03 LAB — VANCOMYCIN, TROUGH: Vancomycin Tr: 20 ug/mL (ref 15–20)

## 2017-01-05 LAB — RENAL FUNCTION PANEL
Albumin: 2.3 g/dL — ABNORMAL LOW (ref 3.5–5.0)
Anion gap: 14 (ref 5–15)
BUN: 61 mg/dL — AB (ref 6–20)
CALCIUM: 9.2 mg/dL (ref 8.9–10.3)
CO2: 20 mmol/L — AB (ref 22–32)
CREATININE: 2.45 mg/dL — AB (ref 0.61–1.24)
Chloride: 102 mmol/L (ref 101–111)
GFR calc non Af Amer: 25 mL/min — ABNORMAL LOW (ref >60)
GFR, EST AFRICAN AMERICAN: 29 mL/min — AB (ref >60)
Glucose, Bld: 218 mg/dL — ABNORMAL HIGH (ref 65–99)
Phosphorus: 5.1 mg/dL — ABNORMAL HIGH (ref 2.5–4.6)
Potassium: 4.4 mmol/L (ref 3.5–5.1)
SODIUM: 136 mmol/L (ref 135–145)

## 2017-01-05 LAB — CBC
HCT: 33.4 % — ABNORMAL LOW (ref 39.0–52.0)
Hemoglobin: 10.1 g/dL — ABNORMAL LOW (ref 13.0–17.0)
MCH: 28.1 pg (ref 26.0–34.0)
MCHC: 30.2 g/dL (ref 30.0–36.0)
MCV: 93 fL (ref 78.0–100.0)
PLATELETS: 403 10*3/uL — AB (ref 150–400)
RBC: 3.59 MIL/uL — AB (ref 4.22–5.81)
RDW: 17.8 % — AB (ref 11.5–15.5)
WBC: 13.2 10*3/uL — AB (ref 4.0–10.5)

## 2017-01-05 LAB — VANCOMYCIN, TROUGH: VANCOMYCIN TR: 20 ug/mL (ref 15–20)

## 2017-01-05 LAB — MAGNESIUM: Magnesium: 1.8 mg/dL (ref 1.7–2.4)

## 2017-01-05 NOTE — Progress Notes (Signed)
[facility]  ROUNDING NOTE   Subjective:  Renal function improving.  Cr down to 2.37 at the moment.  Pt on CPAP this am.   Objective:  Vital signs in last 24 hours:  Temperature 96.9 pulse 88 respirations 12 blood pressure 113/74  Physical Exam: General: No acute distress, obese  Head: Normocephalic, atraumatic. Moist oral mucosal membranes  Eyes: Anicteric  Neck: Supple  Lungs:  normal effort, decreased breath sounds, on CPAP  Heart: S1S2 no rubs  Abdomen:  Soft, nontender  Extremities: Chronic stasis dermatitis  Neurologic: Awake, alert, following commands            Basic Metabolic Panel:  Recent Labs Lab 01/01/17 0654 01/03/17 0659  NA 135 136  K 5.0 3.9  CL 101 101  CO2 22 24  GLUCOSE 112* 121*  BUN 75* 62*  CREATININE 3.06* 2.37*  CALCIUM 9.1 9.1  MG  --  1.9  PHOS 5.5* 3.7    Liver Function Tests:  Recent Labs Lab 01/01/17 0654 01/03/17 0659  ALBUMIN 2.0* 1.9*   No results for input(s): LIPASE, AMYLASE in the last 168 hours. No results for input(s): AMMONIA in the last 168 hours.  CBC:  Recent Labs Lab 01/03/17 0659  WBC 14.4*  HGB 8.8*  HCT 28.9*  MCV 93.5  PLT 413*    Cardiac Enzymes: No results for input(s): CKTOTAL, CKMB, CKMBINDEX, TROPONINI in the last 168 hours.  BNP: Invalid input(s): POCBNP  CBG: No results for input(s): GLUCAP in the last 168 hours.  Microbiology: Results for orders placed or performed during the hospital encounter of 12/08/16  Culture, Urine     Status: Abnormal   Collection Time: 12/11/16  4:07 PM  Result Value Ref Range Status   Specimen Description URINE, RANDOM  Final   Special Requests NONE  Final   Culture >=100,000 COLONIES/mL YEAST (A)  Final   Report Status 12/12/2016 FINAL  Final  Culture, blood (routine x 2)     Status: Abnormal   Collection Time: 12/13/16 10:58 AM  Result Value Ref Range Status   Specimen Description BLOOD LEFT ARM  Final   Special Requests IN  PEDIATRIC BOTTLE Blood Culture adequate volume  Final   Culture  Setup Time   Final    GRAM POSITIVE COCCI IN CLUSTERS AEROBIC BOTTLE ONLY CRITICAL RESULT CALLED TO, READ BACK BY AND VERIFIED WITH: M WISE 12/14/16 @ 0945 M VESTAL    Culture (A)  Final    STAPHYLOCOCCUS AUREUS STAPHYLOCOCCUS SPECIES (COAGULASE NEGATIVE) THE SIGNIFICANCE OF ISOLATING THIS ORGANISM FROM A SINGLE SET OF BLOOD CULTURES WHEN MULTIPLE SETS ARE DRAWN IS UNCERTAIN. PLEASE NOTIFY THE MICROBIOLOGY DEPARTMENT WITHIN ONE WEEK IF SPECIATION AND SENSITIVITIES ARE REQUIRED.    Report Status 12/16/2016 FINAL  Final   Organism ID, Bacteria STAPHYLOCOCCUS AUREUS  Final      Susceptibility   Staphylococcus aureus - MIC*    CIPROFLOXACIN <=0.5 SENSITIVE Sensitive     ERYTHROMYCIN <=0.25 SENSITIVE Sensitive     GENTAMICIN <=0.5 SENSITIVE Sensitive     OXACILLIN 0.5 SENSITIVE Sensitive     TETRACYCLINE <=1 SENSITIVE Sensitive     VANCOMYCIN <=0.5 SENSITIVE Sensitive     TRIMETH/SULFA <=10 SENSITIVE Sensitive     CLINDAMYCIN <=0.25 SENSITIVE Sensitive     RIFAMPIN <=0.5 SENSITIVE Sensitive     Inducible Clindamycin NEGATIVE Sensitive     * STAPHYLOCOCCUS AUREUS  Culture, blood (routine x 2)     Status: None   Collection Time: 12/13/16  10:58 AM  Result Value Ref Range Status   Specimen Description BLOOD LEFT HAND  Final   Special Requests IN PEDIATRIC BOTTLE Blood Culture adequate volume  Final   Culture NO GROWTH 5 DAYS  Final   Report Status 12/18/2016 FINAL  Final  Blood Culture ID Panel (Reflexed)     Status: Abnormal   Collection Time: 12/13/16 10:58 AM  Result Value Ref Range Status   Enterococcus species NOT DETECTED NOT DETECTED Final   Listeria monocytogenes NOT DETECTED NOT DETECTED Final   Staphylococcus species DETECTED (A) NOT DETECTED Final    Comment: CRITICAL RESULT CALLED TO, READ BACK BY AND VERIFIED WITH: M WISE 12/14/16 @ 0945 M VESTAL    Staphylococcus aureus DETECTED (A) NOT DETECTED Final     Comment: Methicillin (oxacillin)-resistant Staphylococcus aureus (MRSA). MRSA is predictably resistant to beta-lactam antibiotics (except ceftaroline). Preferred therapy is vancomycin unless clinically contraindicated. Patient requires contact precautions if  hospitalized. CRITICAL RESULT CALLED TO, READ BACK BY AND VERIFIED WITH: M WISE 12/14/16 @ 0945 M VESTAL    Methicillin resistance DETECTED (A) NOT DETECTED Final    Comment: CRITICAL RESULT CALLED TO, READ BACK BY AND VERIFIED WITH: M WISE 12/14/16 @ 0945 M VESTAL    Streptococcus species NOT DETECTED NOT DETECTED Final   Streptococcus agalactiae NOT DETECTED NOT DETECTED Final   Streptococcus pneumoniae NOT DETECTED NOT DETECTED Final   Streptococcus pyogenes NOT DETECTED NOT DETECTED Final   Acinetobacter baumannii NOT DETECTED NOT DETECTED Final   Enterobacteriaceae species NOT DETECTED NOT DETECTED Final   Enterobacter cloacae complex NOT DETECTED NOT DETECTED Final   Escherichia coli NOT DETECTED NOT DETECTED Final   Klebsiella oxytoca NOT DETECTED NOT DETECTED Final   Klebsiella pneumoniae NOT DETECTED NOT DETECTED Final   Proteus species NOT DETECTED NOT DETECTED Final   Serratia marcescens NOT DETECTED NOT DETECTED Final   Haemophilus influenzae NOT DETECTED NOT DETECTED Final   Neisseria meningitidis NOT DETECTED NOT DETECTED Final   Pseudomonas aeruginosa NOT DETECTED NOT DETECTED Final   Candida albicans NOT DETECTED NOT DETECTED Final   Candida glabrata NOT DETECTED NOT DETECTED Final   Candida krusei NOT DETECTED NOT DETECTED Final   Candida parapsilosis NOT DETECTED NOT DETECTED Final   Candida tropicalis NOT DETECTED NOT DETECTED Final  Culture, blood (routine x 2)     Status: None   Collection Time: 12/18/16  4:43 PM  Result Value Ref Range Status   Specimen Description BLOOD LEFT ANTECUBITAL  Final   Special Requests   Final    BOTTLES DRAWN AEROBIC ONLY Blood Culture results may not be optimal due to an  inadequate volume of blood received in culture bottles   Culture NO GROWTH 5 DAYS  Final   Report Status 12/23/2016 FINAL  Final  Culture, blood (routine x 2)     Status: None   Collection Time: 12/18/16  4:43 PM  Result Value Ref Range Status   Specimen Description BLOOD LEFT ANTECUBITAL  Final   Special Requests   Final    BOTTLES DRAWN AEROBIC ONLY Blood Culture results may not be optimal due to an inadequate volume of blood received in culture bottles   Culture NO GROWTH 5 DAYS  Final   Report Status 12/23/2016 FINAL  Final  Culture, blood (routine x 2)     Status: None   Collection Time: 12/22/16 11:55 AM  Result Value Ref Range Status   Specimen Description BLOOD RIGHT HAND  Final   Special Requests  IN PEDIATRIC BOTTLE Blood Culture adequate volume  Final   Culture NO GROWTH 5 DAYS  Final   Report Status 12/27/2016 FINAL  Final  Culture, blood (routine x 2)     Status: Abnormal   Collection Time: 12/22/16 11:55 AM  Result Value Ref Range Status   Specimen Description BLOOD RIGHT WRIST  Final   Special Requests IN PEDIATRIC BOTTLE Blood Culture adequate volume  Final   Culture  Setup Time   Final    GRAM POSITIVE COCCI IN CLUSTERS AEROBIC BOTTLE ONLY CRITICAL RESULT CALLED TO, READ BACK BY AND VERIFIED WITH: S [place] 12/23/16 @ 1822 M VESTAL    Culture (A)  Final    STAPHYLOCOCCUS SPECIES (COAGULASE NEGATIVE) THE SIGNIFICANCE OF ISOLATING THIS ORGANISM FROM A SINGLE SET OF BLOOD CULTURES WHEN MULTIPLE SETS ARE DRAWN IS UNCERTAIN. PLEASE NOTIFY THE MICROBIOLOGY DEPARTMENT WITHIN ONE WEEK IF SPECIATION AND SENSITIVITIES ARE REQUIRED.    Report Status 12/25/2016 FINAL  Final  Blood Culture ID Panel (Reflexed)     Status: None   Collection Time: 12/22/16 11:55 AM  Result Value Ref Range Status   Enterococcus species NOT DETECTED NOT DETECTED Final   Listeria monocytogenes NOT DETECTED NOT DETECTED Final   Staphylococcus species NOT DETECTED NOT DETECTED Final   Staphylococcus  aureus NOT DETECTED NOT DETECTED Final   Streptococcus species NOT DETECTED NOT DETECTED Final   Streptococcus agalactiae NOT DETECTED NOT DETECTED Final   Streptococcus pneumoniae NOT DETECTED NOT DETECTED Final   Streptococcus pyogenes NOT DETECTED NOT DETECTED Final   Acinetobacter baumannii NOT DETECTED NOT DETECTED Final   Enterobacteriaceae species NOT DETECTED NOT DETECTED Final   Enterobacter cloacae complex NOT DETECTED NOT DETECTED Final   Escherichia coli NOT DETECTED NOT DETECTED Final   Klebsiella oxytoca NOT DETECTED NOT DETECTED Final   Klebsiella pneumoniae NOT DETECTED NOT DETECTED Final   Proteus species NOT DETECTED NOT DETECTED Final   Serratia marcescens NOT DETECTED NOT DETECTED Final   Haemophilus influenzae NOT DETECTED NOT DETECTED Final   Neisseria meningitidis NOT DETECTED NOT DETECTED Final   Pseudomonas aeruginosa NOT DETECTED NOT DETECTED Final   Candida albicans NOT DETECTED NOT DETECTED Final   Candida glabrata NOT DETECTED NOT DETECTED Final   Candida krusei NOT DETECTED NOT DETECTED Final   Candida parapsilosis NOT DETECTED NOT DETECTED Final   Candida tropicalis NOT DETECTED NOT DETECTED Final    Coagulation Studies: No results for input(s): LABPROT, INR in the last 72 hours.  Urinalysis: No results for input(s): COLORURINE, LABSPEC, PHURINE, GLUCOSEU, HGBUR, BILIRUBINUR, KETONESUR, PROTEINUR, UROBILINOGEN, NITRITE, LEUKOCYTESUR in the last 72 hours.  Invalid input(s): APPERANCEUR    Imaging: No results found.   Medications:       Assessment/ Plan:  71 y.o. male with a PMHx of acute on chronic diastolic heart,acute renal failure with underlying chronic kidney disease stage III, recent Escherichia coli bacteremia with urinary tract infection, atrial fibrillation, history of acute pericardial effusion, chronic traumatic encephalopathy, diabetes mellitus type 2, morbid obesity, obstructive sleep apnea, hypertension, debility, chronic venous  ulceration, and generalized edema, recent metabolic encephalopathy, h/o Chronic traumatic encephalopathy (CTE) foot ball injury; who was admitted [facility] for ongoing management.   1. Acute renal failure, dialysis stopped 12/22/16. 2. Chronic kidney disease stage 3 baseline Cr 2.0. Likely diabetic nephropathy 3. Secondary  Hyperparathyroidism. PTH 159 (12/12/16) 4. Anemia of CKD 5. Hyponatremia. 6. Chronic diastolic heart failure. 7. Lower extremity edema. 8. Hyperkalemia 9. Fever, sepsis- positive blood culture  Plan:  Creatinine is trending down at the moment, currently 2.37.  This is close to his baseline Cr.  Avoid nephrotoxins as possible.  Continue to monitor renal function trend periodically. In addition continue to monitor hemoglobin as it is currently 8.8.  We will continue to periodically monitor the patient's progress.      LOS: 0 Jamie Kaiser 7/13/20188:26 AM

## 2017-01-06 LAB — CBC
HCT: 33 % — ABNORMAL LOW (ref 39.0–52.0)
Hemoglobin: 10.1 g/dL — ABNORMAL LOW (ref 13.0–17.0)
MCH: 28.3 pg (ref 26.0–34.0)
MCHC: 30.6 g/dL (ref 30.0–36.0)
MCV: 92.4 fL (ref 78.0–100.0)
PLATELETS: 377 10*3/uL (ref 150–400)
RBC: 3.57 MIL/uL — AB (ref 4.22–5.81)
RDW: 18 % — ABNORMAL HIGH (ref 11.5–15.5)
WBC: 11.4 10*3/uL — ABNORMAL HIGH (ref 4.0–10.5)

## 2017-01-06 LAB — RENAL FUNCTION PANEL
ALBUMIN: 2.2 g/dL — AB (ref 3.5–5.0)
Anion gap: 12 (ref 5–15)
BUN: 59 mg/dL — ABNORMAL HIGH (ref 6–20)
CALCIUM: 9.5 mg/dL (ref 8.9–10.3)
CO2: 23 mmol/L (ref 22–32)
CREATININE: 2.24 mg/dL — AB (ref 0.61–1.24)
Chloride: 103 mmol/L (ref 101–111)
GFR calc non Af Amer: 28 mL/min — ABNORMAL LOW (ref >60)
GFR, EST AFRICAN AMERICAN: 33 mL/min — AB (ref >60)
GLUCOSE: 107 mg/dL — AB (ref 65–99)
PHOSPHORUS: 4.9 mg/dL — AB (ref 2.5–4.6)
Potassium: 4.5 mmol/L (ref 3.5–5.1)
SODIUM: 138 mmol/L (ref 135–145)

## 2017-01-06 LAB — MAGNESIUM: Magnesium: 1.8 mg/dL (ref 1.7–2.4)

## 2017-01-08 NOTE — Progress Notes (Signed)
[facility]  ROUNDING NOTE   Subjective:  Renal function improving.  Cr down to 2.2 at the moment.  Sitting up in chair  Objective:  Vital signs in last 24 hours:  Temperature 97.9, pulse 91, respirations 13, blood pressure 107/66  Physical Exam: General: No acute distress, obese  Head: Normocephalic, atraumatic. Moist oral mucosal membranes  Eyes: Anicteric  Neck: Supple  Lungs:  normal effort, decreased breath sounds,   Heart: S1S2 no rubs  Abdomen:  Soft, nontender  Extremities: Chronic stasis dermatitis  Neurologic: Awake, alert, following commands            Basic Metabolic Panel:  Recent Labs Lab 01/03/17 0659 01/05/17 1011 01/06/17 0741  NA 136 136 138  K 3.9 4.4 4.5  CL 101 102 103  CO2 24 20* 23  GLUCOSE 121* 218* 107*  BUN 62* 61* 59*  CREATININE 2.37* 2.45* 2.24*  CALCIUM 9.1 9.2 9.5  MG 1.9 1.8 1.8  PHOS 3.7 5.1* 4.9*    Liver Function Tests:  Recent Labs Lab 01/03/17 0659 01/05/17 1011 01/06/17 0741  ALBUMIN 1.9* 2.3* 2.2*   No results for input(s): LIPASE, AMYLASE in the last 168 hours. No results for input(s): AMMONIA in the last 168 hours.  CBC:  Recent Labs Lab 01/03/17 0659 01/05/17 1011 01/06/17 0741  WBC 14.4* 13.2* 11.4*  HGB 8.8* 10.1* 10.1*  HCT 28.9* 33.4* 33.0*  MCV 93.5 93.0 92.4  PLT 413* 403* 377    Cardiac Enzymes: No results for input(s): CKTOTAL, CKMB, CKMBINDEX, TROPONINI in the last 168 hours.  BNP: Invalid input(s): POCBNP  CBG: No results for input(s): GLUCAP in the last 168 hours.  Microbiology: Results for orders placed or performed during the hospital encounter of 12/08/16  Culture, Urine     Status: Abnormal   Collection Time: 12/11/16  4:07 PM  Result Value Ref Range Status   Specimen Description URINE, RANDOM  Final   Special Requests NONE  Final   Culture >=100,000 COLONIES/mL YEAST (A)  Final   Report Status 12/12/2016 FINAL  Final  Culture, blood (routine x 2)     Status:  Abnormal   Collection Time: 12/13/16 10:58 AM  Result Value Ref Range Status   Specimen Description BLOOD LEFT ARM  Final   Special Requests IN PEDIATRIC BOTTLE Blood Culture adequate volume  Final   Culture  Setup Time   Final    GRAM POSITIVE COCCI IN CLUSTERS AEROBIC BOTTLE ONLY CRITICAL RESULT CALLED TO, READ BACK BY AND VERIFIED WITH: M WISE 12/14/16 @ 0945 M VESTAL    Culture (A)  Final    STAPHYLOCOCCUS AUREUS STAPHYLOCOCCUS SPECIES (COAGULASE NEGATIVE) THE SIGNIFICANCE OF ISOLATING THIS ORGANISM FROM A SINGLE SET OF BLOOD CULTURES WHEN MULTIPLE SETS ARE DRAWN IS UNCERTAIN. PLEASE NOTIFY THE MICROBIOLOGY DEPARTMENT WITHIN ONE WEEK IF SPECIATION AND SENSITIVITIES ARE REQUIRED.    Report Status 12/16/2016 FINAL  Final   Organism ID, Bacteria STAPHYLOCOCCUS AUREUS  Final      Susceptibility   Staphylococcus aureus - MIC*    CIPROFLOXACIN <=0.5 SENSITIVE Sensitive     ERYTHROMYCIN <=0.25 SENSITIVE Sensitive     GENTAMICIN <=0.5 SENSITIVE Sensitive     OXACILLIN 0.5 SENSITIVE Sensitive     TETRACYCLINE <=1 SENSITIVE Sensitive     VANCOMYCIN <=0.5 SENSITIVE Sensitive     TRIMETH/SULFA <=10 SENSITIVE Sensitive     CLINDAMYCIN <=0.25 SENSITIVE Sensitive     RIFAMPIN <=0.5 SENSITIVE Sensitive     Inducible Clindamycin NEGATIVE Sensitive     *  STAPHYLOCOCCUS AUREUS  Culture, blood (routine x 2)     Status: None   Collection Time: 12/13/16 10:58 AM  Result Value Ref Range Status   Specimen Description BLOOD LEFT HAND  Final   Special Requests IN PEDIATRIC BOTTLE Blood Culture adequate volume  Final   Culture NO GROWTH 5 DAYS  Final   Report Status 12/18/2016 FINAL  Final  Blood Culture ID Panel (Reflexed)     Status: Abnormal   Collection Time: 12/13/16 10:58 AM  Result Value Ref Range Status   Enterococcus species NOT DETECTED NOT DETECTED Final   Listeria monocytogenes NOT DETECTED NOT DETECTED Final   Staphylococcus species DETECTED (A) NOT DETECTED Final    Comment: CRITICAL  RESULT CALLED TO, READ BACK BY AND VERIFIED WITH: M WISE 12/14/16 @ 0945 M VESTAL    Staphylococcus aureus DETECTED (A) NOT DETECTED Final    Comment: Methicillin (oxacillin)-resistant Staphylococcus aureus (MRSA). MRSA is predictably resistant to beta-lactam antibiotics (except ceftaroline). Preferred therapy is vancomycin unless clinically contraindicated. Patient requires contact precautions if  hospitalized. CRITICAL RESULT CALLED TO, READ BACK BY AND VERIFIED WITH: M WISE 12/14/16 @ 0945 M VESTAL    Methicillin resistance DETECTED (A) NOT DETECTED Final    Comment: CRITICAL RESULT CALLED TO, READ BACK BY AND VERIFIED WITH: M WISE 12/14/16 @ 0945 M VESTAL    Streptococcus species NOT DETECTED NOT DETECTED Final   Streptococcus agalactiae NOT DETECTED NOT DETECTED Final   Streptococcus pneumoniae NOT DETECTED NOT DETECTED Final   Streptococcus pyogenes NOT DETECTED NOT DETECTED Final   Acinetobacter baumannii NOT DETECTED NOT DETECTED Final   Enterobacteriaceae species NOT DETECTED NOT DETECTED Final   Enterobacter cloacae complex NOT DETECTED NOT DETECTED Final   Escherichia coli NOT DETECTED NOT DETECTED Final   Klebsiella oxytoca NOT DETECTED NOT DETECTED Final   Klebsiella pneumoniae NOT DETECTED NOT DETECTED Final   Proteus species NOT DETECTED NOT DETECTED Final   Serratia marcescens NOT DETECTED NOT DETECTED Final   Haemophilus influenzae NOT DETECTED NOT DETECTED Final   Neisseria meningitidis NOT DETECTED NOT DETECTED Final   Pseudomonas aeruginosa NOT DETECTED NOT DETECTED Final   Candida albicans NOT DETECTED NOT DETECTED Final   Candida glabrata NOT DETECTED NOT DETECTED Final   Candida krusei NOT DETECTED NOT DETECTED Final   Candida parapsilosis NOT DETECTED NOT DETECTED Final   Candida tropicalis NOT DETECTED NOT DETECTED Final  Culture, blood (routine x 2)     Status: None   Collection Time: 12/18/16  4:43 PM  Result Value Ref Range Status   Specimen Description  BLOOD LEFT ANTECUBITAL  Final   Special Requests   Final    BOTTLES DRAWN AEROBIC ONLY Blood Culture results may not be optimal due to an inadequate volume of blood received in culture bottles   Culture NO GROWTH 5 DAYS  Final   Report Status 12/23/2016 FINAL  Final  Culture, blood (routine x 2)     Status: None   Collection Time: 12/18/16  4:43 PM  Result Value Ref Range Status   Specimen Description BLOOD LEFT ANTECUBITAL  Final   Special Requests   Final    BOTTLES DRAWN AEROBIC ONLY Blood Culture results may not be optimal due to an inadequate volume of blood received in culture bottles   Culture NO GROWTH 5 DAYS  Final   Report Status 12/23/2016 FINAL  Final  Culture, blood (routine x 2)     Status: None   Collection Time: 12/22/16 11:55 AM  Result Value Ref Range Status   Specimen Description BLOOD RIGHT HAND  Final   Special Requests IN PEDIATRIC BOTTLE Blood Culture adequate volume  Final   Culture NO GROWTH 5 DAYS  Final   Report Status 12/27/2016 FINAL  Final  Culture, blood (routine x 2)     Status: Abnormal   Collection Time: 12/22/16 11:55 AM  Result Value Ref Range Status   Specimen Description BLOOD RIGHT WRIST  Final   Special Requests IN PEDIATRIC BOTTLE Blood Culture adequate volume  Final   Culture  Setup Time   Final    GRAM POSITIVE COCCI IN CLUSTERS AEROBIC BOTTLE ONLY CRITICAL RESULT CALLED TO, READ BACK BY AND VERIFIED WITH: S CULLERS 12/23/16 @ 1822 M VESTAL    Culture (A)  Final    STAPHYLOCOCCUS SPECIES (COAGULASE NEGATIVE) THE SIGNIFICANCE OF ISOLATING THIS ORGANISM FROM A SINGLE SET OF BLOOD CULTURES WHEN MULTIPLE SETS ARE DRAWN IS UNCERTAIN. PLEASE NOTIFY THE MICROBIOLOGY DEPARTMENT WITHIN ONE WEEK IF SPECIATION AND SENSITIVITIES ARE REQUIRED.    Report Status 12/25/2016 FINAL  Final  Blood Culture ID Panel (Reflexed)     Status: None   Collection Time: 12/22/16 11:55 AM  Result Value Ref Range Status   Enterococcus species NOT DETECTED NOT DETECTED  Final   Listeria monocytogenes NOT DETECTED NOT DETECTED Final   Staphylococcus species NOT DETECTED NOT DETECTED Final   Staphylococcus aureus NOT DETECTED NOT DETECTED Final   Streptococcus species NOT DETECTED NOT DETECTED Final   Streptococcus agalactiae NOT DETECTED NOT DETECTED Final   Streptococcus pneumoniae NOT DETECTED NOT DETECTED Final   Streptococcus pyogenes NOT DETECTED NOT DETECTED Final   Acinetobacter baumannii NOT DETECTED NOT DETECTED Final   Enterobacteriaceae species NOT DETECTED NOT DETECTED Final   Enterobacter cloacae complex NOT DETECTED NOT DETECTED Final   Escherichia coli NOT DETECTED NOT DETECTED Final   Klebsiella oxytoca NOT DETECTED NOT DETECTED Final   Klebsiella pneumoniae NOT DETECTED NOT DETECTED Final   Proteus species NOT DETECTED NOT DETECTED Final   Serratia marcescens NOT DETECTED NOT DETECTED Final   Haemophilus influenzae NOT DETECTED NOT DETECTED Final   Neisseria meningitidis NOT DETECTED NOT DETECTED Final   Pseudomonas aeruginosa NOT DETECTED NOT DETECTED Final   Candida albicans NOT DETECTED NOT DETECTED Final   Candida glabrata NOT DETECTED NOT DETECTED Final   Candida krusei NOT DETECTED NOT DETECTED Final   Candida parapsilosis NOT DETECTED NOT DETECTED Final   Candida tropicalis NOT DETECTED NOT DETECTED Final    Coagulation Studies: No results for input(s): LABPROT, INR in the last 72 hours.  Urinalysis: No results for input(s): COLORURINE, LABSPEC, PHURINE, GLUCOSEU, HGBUR, BILIRUBINUR, KETONESUR, PROTEINUR, UROBILINOGEN, NITRITE, LEUKOCYTESUR in the last 72 hours.  Invalid input(s): APPERANCEUR    Imaging: No results found.   Medications:       Assessment/ Plan:  71 y.o. male with a PMHx of acute on chronic diastolic heart,acute renal failure with underlying chronic kidney disease stage III, recent Escherichia coli bacteremia with urinary tract infection, atrial fibrillation, history of acute pericardial effusion,  chronic traumatic encephalopathy, diabetes mellitus type 2, morbid obesity, obstructive sleep apnea, hypertension, debility, chronic venous ulceration, and generalized edema, recent metabolic encephalopathy, h/o Chronic traumatic encephalopathy (CTE) foot ball injury; who was admitted [facility] for ongoing management.   1. Acute renal failure, dialysis stopped 12/22/16. 2. Chronic kidney disease stage 3 baseline Cr 2.0. Likely diabetic nephropathy 3. Secondary  Hyperparathyroidism. PTH 159 (12/12/16) 4. Anemia of CKD 5. Hyponatremia.  6. Chronic diastolic heart failure. 7. Lower extremity edema. 8. Hyperkalemia   Plan:  Creatinine is trending down at the moment, currently 2.2.  This is close to his baseline Cr.  Avoid nephrotoxins as possible.  Continue to monitor renal function trend periodically. In addition continue to monitor hemoglobin.  We will continue to periodically monitor the patient's progress.      LOS: 0 Jamie Kaiser 7/16/20183:48 PM

## 2017-01-10 ENCOUNTER — Other Ambulatory Visit: Payer: Self-pay

## 2017-01-10 NOTE — Progress Notes (Signed)
[facility]  ROUNDING NOTE   Subjective:  Renal function appears to be stabilizing.  Cr down to 2.2 on 7/14. No new labs today    Objective:  Vital signs in last 24 hours:  Temperature 98.4, pulse 92, respirations 18, blood pressure 108/73  Physical Exam: General: No acute distress, obese  Head: Normocephalic, atraumatic. Moist oral mucosal membranes  Eyes: Anicteric  Neck: Supple  Lungs:  normal effort, decreased breath sounds,   Heart: S1S2 no rubs  Abdomen:  Soft, nontender  Extremities: Chronic stasis dermatitis  Neurologic: Awake, alert, following commands            Basic Metabolic Panel:  Recent Labs Lab 01/05/17 1011 01/06/17 0741  NA 136 138  K 4.4 4.5  CL 102 103  CO2 20* 23  GLUCOSE 218* 107*  BUN 61* 59*  CREATININE 2.45* 2.24*  CALCIUM 9.2 9.5  MG 1.8 1.8  PHOS 5.1* 4.9*    Liver Function Tests:  Recent Labs Lab 01/05/17 1011 01/06/17 0741  ALBUMIN 2.3* 2.2*   No results for input(s): LIPASE, AMYLASE in the last 168 hours. No results for input(s): AMMONIA in the last 168 hours.  CBC:  Recent Labs Lab 01/05/17 1011 01/06/17 0741  WBC 13.2* 11.4*  HGB 10.1* 10.1*  HCT 33.4* 33.0*  MCV 93.0 92.4  PLT 403* 377    Cardiac Enzymes: No results for input(s): CKTOTAL, CKMB, CKMBINDEX, TROPONINI in the last 168 hours.  BNP: Invalid input(s): POCBNP  CBG: No results for input(s): GLUCAP in the last 168 hours.  Microbiology: Results for orders placed or performed during the hospital encounter of 12/08/16  Culture, Urine     Status: Abnormal   Collection Time: 12/11/16  4:07 PM  Result Value Ref Range Status   Specimen Description URINE, RANDOM  Final   Special Requests NONE  Final   Culture >=100,000 COLONIES/mL YEAST (A)  Final   Report Status 12/12/2016 FINAL  Final  Culture, blood (routine x 2)     Status: Abnormal   Collection Time: 12/13/16 10:58 AM  Result Value Ref Range Status   Specimen Description BLOOD  LEFT ARM  Final   Special Requests IN PEDIATRIC BOTTLE Blood Culture adequate volume  Final   Culture  Setup Time   Final    GRAM POSITIVE COCCI IN CLUSTERS AEROBIC BOTTLE ONLY CRITICAL RESULT CALLED TO, READ BACK BY AND VERIFIED WITH: M WISE 12/14/16 @ 0945 M VESTAL    Culture (A)  Final    STAPHYLOCOCCUS AUREUS STAPHYLOCOCCUS SPECIES (COAGULASE NEGATIVE) THE SIGNIFICANCE OF ISOLATING THIS ORGANISM FROM A SINGLE SET OF BLOOD CULTURES WHEN MULTIPLE SETS ARE DRAWN IS UNCERTAIN. PLEASE NOTIFY THE MICROBIOLOGY DEPARTMENT WITHIN ONE WEEK IF SPECIATION AND SENSITIVITIES ARE REQUIRED.    Report Status 12/16/2016 FINAL  Final   Organism ID, Bacteria STAPHYLOCOCCUS AUREUS  Final      Susceptibility   Staphylococcus aureus - MIC*    CIPROFLOXACIN <=0.5 SENSITIVE Sensitive     ERYTHROMYCIN <=0.25 SENSITIVE Sensitive     GENTAMICIN <=0.5 SENSITIVE Sensitive     OXACILLIN 0.5 SENSITIVE Sensitive     TETRACYCLINE <=1 SENSITIVE Sensitive     VANCOMYCIN <=0.5 SENSITIVE Sensitive     TRIMETH/SULFA <=10 SENSITIVE Sensitive     CLINDAMYCIN <=0.25 SENSITIVE Sensitive     RIFAMPIN <=0.5 SENSITIVE Sensitive     Inducible Clindamycin NEGATIVE Sensitive     * STAPHYLOCOCCUS AUREUS  Culture, blood (routine x 2)     Status: None  Collection Time: 12/13/16 10:58 AM  Result Value Ref Range Status   Specimen Description BLOOD LEFT HAND  Final   Special Requests IN PEDIATRIC BOTTLE Blood Culture adequate volume  Final   Culture NO GROWTH 5 DAYS  Final   Report Status 12/18/2016 FINAL  Final  Blood Culture ID Panel (Reflexed)     Status: Abnormal   Collection Time: 12/13/16 10:58 AM  Result Value Ref Range Status   Enterococcus species NOT DETECTED NOT DETECTED Final   Listeria monocytogenes NOT DETECTED NOT DETECTED Final   Staphylococcus species DETECTED (A) NOT DETECTED Final    Comment: CRITICAL RESULT CALLED TO, READ BACK BY AND VERIFIED WITH: M WISE 12/14/16 @ 0945 M VESTAL    Staphylococcus aureus  DETECTED (A) NOT DETECTED Final    Comment: Methicillin (oxacillin)-resistant Staphylococcus aureus (MRSA). MRSA is predictably resistant to beta-lactam antibiotics (except ceftaroline). Preferred therapy is vancomycin unless clinically contraindicated. Patient requires contact precautions if  hospitalized. CRITICAL RESULT CALLED TO, READ BACK BY AND VERIFIED WITH: M WISE 12/14/16 @ 0945 M VESTAL    Methicillin resistance DETECTED (A) NOT DETECTED Final    Comment: CRITICAL RESULT CALLED TO, READ BACK BY AND VERIFIED WITH: M WISE 12/14/16 @ 0945 M VESTAL    Streptococcus species NOT DETECTED NOT DETECTED Final   Streptococcus agalactiae NOT DETECTED NOT DETECTED Final   Streptococcus pneumoniae NOT DETECTED NOT DETECTED Final   Streptococcus pyogenes NOT DETECTED NOT DETECTED Final   Acinetobacter baumannii NOT DETECTED NOT DETECTED Final   Enterobacteriaceae species NOT DETECTED NOT DETECTED Final   Enterobacter cloacae complex NOT DETECTED NOT DETECTED Final   Escherichia coli NOT DETECTED NOT DETECTED Final   Klebsiella oxytoca NOT DETECTED NOT DETECTED Final   Klebsiella pneumoniae NOT DETECTED NOT DETECTED Final   Proteus species NOT DETECTED NOT DETECTED Final   Serratia marcescens NOT DETECTED NOT DETECTED Final   Haemophilus influenzae NOT DETECTED NOT DETECTED Final   Neisseria meningitidis NOT DETECTED NOT DETECTED Final   Pseudomonas aeruginosa NOT DETECTED NOT DETECTED Final   Candida albicans NOT DETECTED NOT DETECTED Final   Candida glabrata NOT DETECTED NOT DETECTED Final   Candida krusei NOT DETECTED NOT DETECTED Final   Candida parapsilosis NOT DETECTED NOT DETECTED Final   Candida tropicalis NOT DETECTED NOT DETECTED Final  Culture, blood (routine x 2)     Status: None   Collection Time: 12/18/16  4:43 PM  Result Value Ref Range Status   Specimen Description BLOOD LEFT ANTECUBITAL  Final   Special Requests   Final    BOTTLES DRAWN AEROBIC ONLY Blood Culture results  may not be optimal due to an inadequate volume of blood received in culture bottles   Culture NO GROWTH 5 DAYS  Final   Report Status 12/23/2016 FINAL  Final  Culture, blood (routine x 2)     Status: None   Collection Time: 12/18/16  4:43 PM  Result Value Ref Range Status   Specimen Description BLOOD LEFT ANTECUBITAL  Final   Special Requests   Final    BOTTLES DRAWN AEROBIC ONLY Blood Culture results may not be optimal due to an inadequate volume of blood received in culture bottles   Culture NO GROWTH 5 DAYS  Final   Report Status 12/23/2016 FINAL  Final  Culture, blood (routine x 2)     Status: None   Collection Time: 12/22/16 11:55 AM  Result Value Ref Range Status   Specimen Description BLOOD RIGHT HAND  Final  Special Requests IN PEDIATRIC BOTTLE Blood Culture adequate volume  Final   Culture NO GROWTH 5 DAYS  Final   Report Status 12/27/2016 FINAL  Final  Culture, blood (routine x 2)     Status: Abnormal   Collection Time: 12/22/16 11:55 AM  Result Value Ref Range Status   Specimen Description BLOOD RIGHT WRIST  Final   Special Requests IN PEDIATRIC BOTTLE Blood Culture adequate volume  Final   Culture  Setup Time   Final    GRAM POSITIVE COCCI IN CLUSTERS AEROBIC BOTTLE ONLY CRITICAL RESULT CALLED TO, READ BACK BY AND VERIFIED WITH: S CULLERS 12/23/16 @ 1822 M VESTAL    Culture (A)  Final    STAPHYLOCOCCUS SPECIES (COAGULASE NEGATIVE) THE SIGNIFICANCE OF ISOLATING THIS ORGANISM FROM A SINGLE SET OF BLOOD CULTURES WHEN MULTIPLE SETS ARE DRAWN IS UNCERTAIN. PLEASE NOTIFY THE MICROBIOLOGY DEPARTMENT WITHIN ONE WEEK IF SPECIATION AND SENSITIVITIES ARE REQUIRED.    Report Status 12/25/2016 FINAL  Final  Blood Culture ID Panel (Reflexed)     Status: None   Collection Time: 12/22/16 11:55 AM  Result Value Ref Range Status   Enterococcus species NOT DETECTED NOT DETECTED Final   Listeria monocytogenes NOT DETECTED NOT DETECTED Final   Staphylococcus species NOT DETECTED NOT  DETECTED Final   Staphylococcus aureus NOT DETECTED NOT DETECTED Final   Streptococcus species NOT DETECTED NOT DETECTED Final   Streptococcus agalactiae NOT DETECTED NOT DETECTED Final   Streptococcus pneumoniae NOT DETECTED NOT DETECTED Final   Streptococcus pyogenes NOT DETECTED NOT DETECTED Final   Acinetobacter baumannii NOT DETECTED NOT DETECTED Final   Enterobacteriaceae species NOT DETECTED NOT DETECTED Final   Enterobacter cloacae complex NOT DETECTED NOT DETECTED Final   Escherichia coli NOT DETECTED NOT DETECTED Final   Klebsiella oxytoca NOT DETECTED NOT DETECTED Final   Klebsiella pneumoniae NOT DETECTED NOT DETECTED Final   Proteus species NOT DETECTED NOT DETECTED Final   Serratia marcescens NOT DETECTED NOT DETECTED Final   Haemophilus influenzae NOT DETECTED NOT DETECTED Final   Neisseria meningitidis NOT DETECTED NOT DETECTED Final   Pseudomonas aeruginosa NOT DETECTED NOT DETECTED Final   Candida albicans NOT DETECTED NOT DETECTED Final   Candida glabrata NOT DETECTED NOT DETECTED Final   Candida krusei NOT DETECTED NOT DETECTED Final   Candida parapsilosis NOT DETECTED NOT DETECTED Final   Candida tropicalis NOT DETECTED NOT DETECTED Final    Coagulation Studies: No results for input(s): LABPROT, INR in the last 72 hours.  Urinalysis: No results for input(s): COLORURINE, LABSPEC, PHURINE, GLUCOSEU, HGBUR, BILIRUBINUR, KETONESUR, PROTEINUR, UROBILINOGEN, NITRITE, LEUKOCYTESUR in the last 72 hours.  Invalid input(s): APPERANCEUR    Imaging: No results found.   Medications:       Assessment/ Plan:  71 y.o. male with a PMHx of acute on chronic diastolic heart,acute renal failure with underlying chronic kidney disease stage III, recent Escherichia coli bacteremia with urinary tract infection, atrial fibrillation, history of acute pericardial effusion, chronic traumatic encephalopathy, diabetes mellitus type 2, morbid obesity, obstructive sleep apnea,  hypertension, debility, chronic venous ulceration, and generalized edema, recent metabolic encephalopathy, h/o Chronic traumatic encephalopathy (CTE) foot ball injury; who was admitted [facility] for ongoing management.   1. CKD st 4- baseline Cr 2.0. Likely diabetic nephropathy. GFR 28 2. Lower extremity edema. 3. Secondary  Hyperparathyroidism. PTH 159 (12/12/16) 4. Anemia of CKD 5. Hyponatremia.resolved 6. Chronic diastolic heart failure. 7. Hyperkalemia- resolved   Plan:  Creatinine is trending down at the moment,  currently 2.2.  This is close to his baseline Cr.  Avoid nephrotoxins as possible.  Continue to monitor renal function trend periodically. In addition continue to monitor hemoglobin.  We will continue to monitor the patient's progress.      LOS: 0 Jamie Kaiser 7/18/201810:13 AM

## 2017-01-11 LAB — RENAL FUNCTION PANEL
ALBUMIN: 2.1 g/dL — AB (ref 3.5–5.0)
ANION GAP: 9 (ref 5–15)
BUN: 55 mg/dL — ABNORMAL HIGH (ref 6–20)
CALCIUM: 9.1 mg/dL (ref 8.9–10.3)
CO2: 25 mmol/L (ref 22–32)
Chloride: 104 mmol/L (ref 101–111)
Creatinine, Ser: 2.26 mg/dL — ABNORMAL HIGH (ref 0.61–1.24)
GFR calc non Af Amer: 28 mL/min — ABNORMAL LOW (ref >60)
GFR, EST AFRICAN AMERICAN: 32 mL/min — AB (ref >60)
Glucose, Bld: 104 mg/dL — ABNORMAL HIGH (ref 65–99)
PHOSPHORUS: 4.8 mg/dL — AB (ref 2.5–4.6)
POTASSIUM: 3.5 mmol/L (ref 3.5–5.1)
SODIUM: 138 mmol/L (ref 135–145)

## 2017-05-26 DEATH — deceased

## 2018-09-12 IMAGING — DX DG CHEST 1V PORT
1 series · 1 of 1 positions shown · non-contrast
Comparison: No prior .

CLINICAL DATA: Fever.

EXAM:
PORTABLE CHEST 1 VIEW

[chest ap]
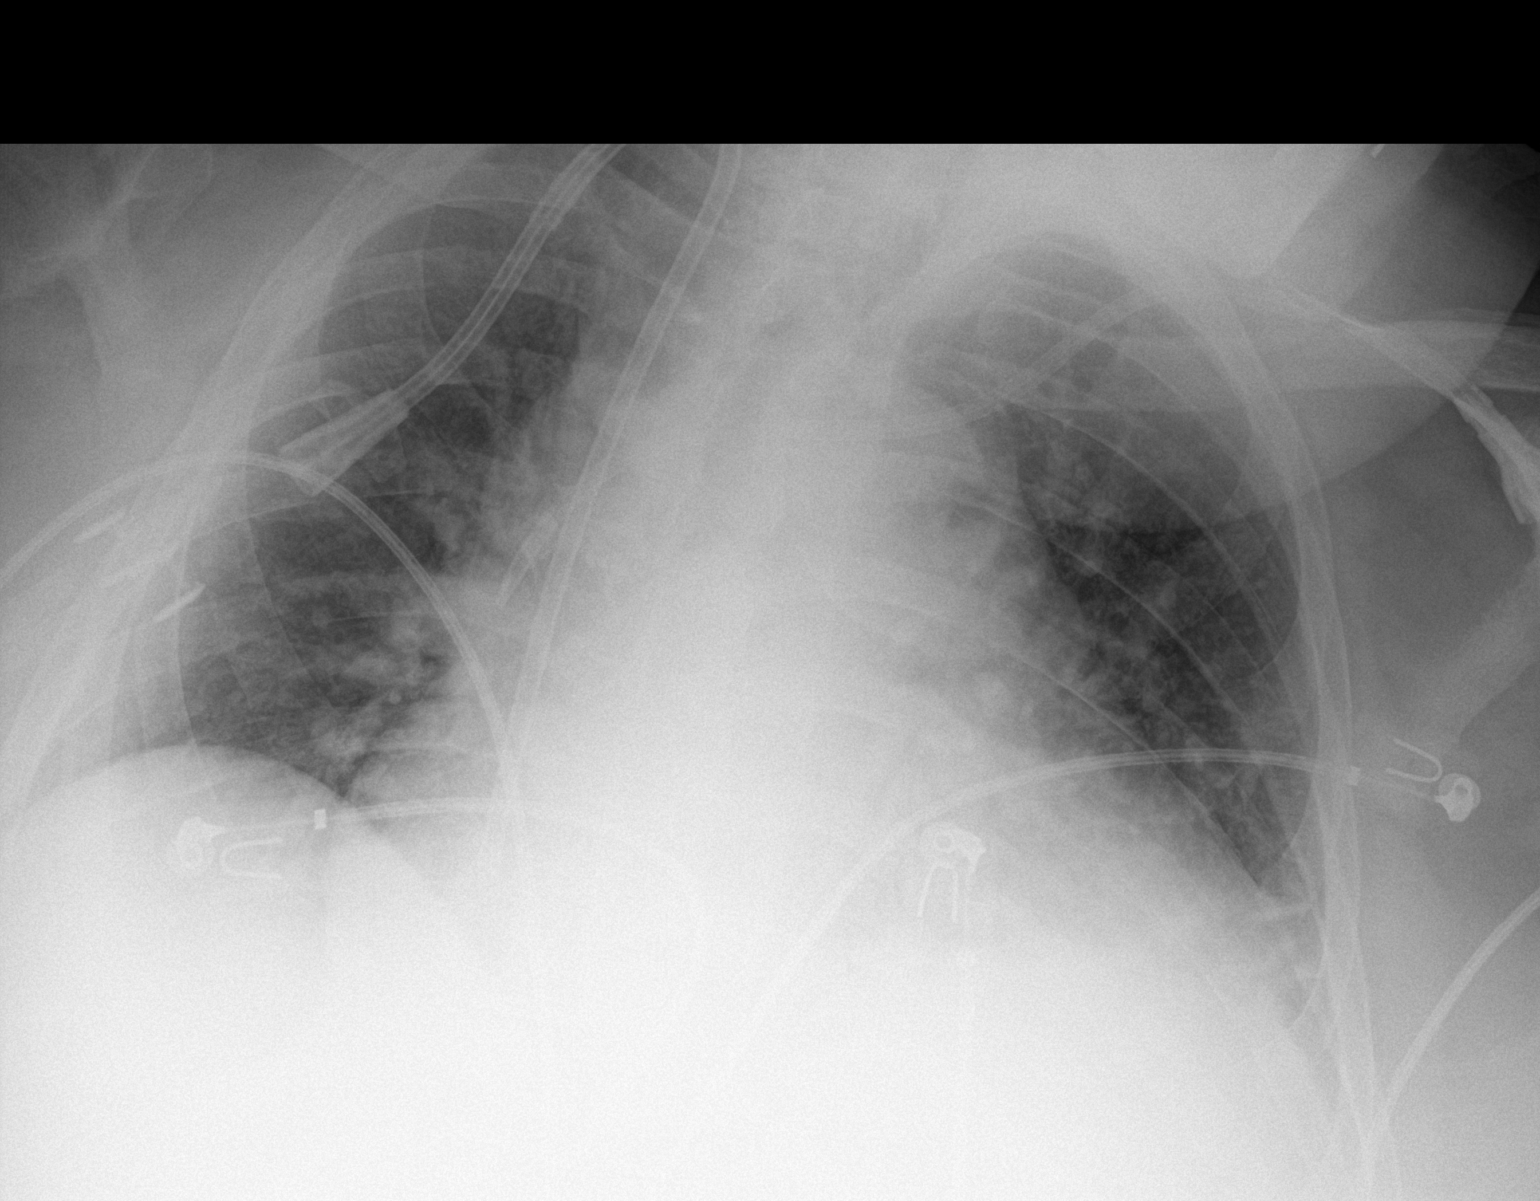

[1 of 1 positions shown; findings below may reference images not displayed]

FINDINGS: Dual-lumen right IJ catheter noted with its tip over the mid right
atrium. Dual-lumen left IJ catheter noted with its tip over the
cavoatrial junction. Cardiomegaly with pulmonary venous congestion.
Low lung volumes with basilar atelectasis. Small left pleural
effusion cannot be excluded. Right costophrenic angle incompletely
imaged. No pneumothorax.
IMPRESSION: 1.  Dual-lumen central catheter is as above.

2. Cardiomegaly with pulmonary venous congestion. Small left pleural
effusion cannot be excluded.

3. Low lung volumes with basilar atelectasis .
